# Patient Record
Sex: Female | Born: 1975 | Race: Asian | Hispanic: No | Marital: Married | Smoking: Never smoker
Health system: Southern US, Community
[De-identification: ages and names within clinical notes are randomized; demographics above are authoritative.]

## PROBLEM LIST (undated history)

## (undated) DIAGNOSIS — J45909 Unspecified asthma, uncomplicated: Secondary | ICD-10-CM

## (undated) DIAGNOSIS — D509 Iron deficiency anemia, unspecified: Secondary | ICD-10-CM

## (undated) DIAGNOSIS — E042 Nontoxic multinodular goiter: Secondary | ICD-10-CM

## (undated) DIAGNOSIS — E785 Hyperlipidemia, unspecified: Secondary | ICD-10-CM

## (undated) HISTORY — DX: Unspecified asthma, uncomplicated: J45.909

## (undated) HISTORY — PX: WISDOM TOOTH EXTRACTION: SHX21

## (undated) HISTORY — DX: Hyperlipidemia, unspecified: E78.5

## (undated) HISTORY — PX: TUBAL LIGATION: SHX77

## (undated) HISTORY — DX: Nontoxic multinodular goiter: E04.2

## (undated) HISTORY — DX: Iron deficiency anemia, unspecified: D50.9

---

## 1997-12-09 ENCOUNTER — Ambulatory Visit (HOSPITAL_COMMUNITY): Admission: RE | Admit: 1997-12-09 | Discharge: 1997-12-09 | Payer: Self-pay | Admitting: Obstetrics and Gynecology

## 1998-01-13 ENCOUNTER — Encounter: Payer: Self-pay | Admitting: Obstetrics and Gynecology

## 1998-01-13 ENCOUNTER — Inpatient Hospital Stay (HOSPITAL_COMMUNITY): Admission: RE | Admit: 1998-01-13 | Discharge: 1998-01-13 | Payer: Self-pay | Admitting: Obstetrics and Gynecology

## 1998-01-16 ENCOUNTER — Encounter: Admission: RE | Admit: 1998-01-16 | Discharge: 1998-02-18 | Payer: Self-pay | Admitting: Obstetrics and Gynecology

## 1998-01-21 ENCOUNTER — Inpatient Hospital Stay (HOSPITAL_COMMUNITY): Admission: AD | Admit: 1998-01-21 | Discharge: 1998-01-21 | Payer: Self-pay | Admitting: Obstetrics and Gynecology

## 1998-02-12 ENCOUNTER — Inpatient Hospital Stay (HOSPITAL_COMMUNITY): Admission: AD | Admit: 1998-02-12 | Discharge: 1998-02-12 | Payer: Self-pay

## 1998-02-17 ENCOUNTER — Encounter: Payer: Self-pay | Admitting: Obstetrics and Gynecology

## 1998-02-17 ENCOUNTER — Inpatient Hospital Stay (HOSPITAL_COMMUNITY): Admission: AD | Admit: 1998-02-17 | Discharge: 1998-02-20 | Payer: Self-pay | Admitting: Obstetrics and Gynecology

## 1998-07-31 ENCOUNTER — Ambulatory Visit: Admission: RE | Admit: 1998-07-31 | Discharge: 1998-07-31 | Payer: Self-pay | Admitting: Obstetrics and Gynecology

## 1998-07-31 ENCOUNTER — Encounter: Payer: Self-pay | Admitting: Obstetrics and Gynecology

## 1999-10-08 ENCOUNTER — Other Ambulatory Visit: Admission: RE | Admit: 1999-10-08 | Discharge: 1999-10-08 | Payer: Self-pay | Admitting: Obstetrics and Gynecology

## 1999-12-14 ENCOUNTER — Ambulatory Visit (HOSPITAL_COMMUNITY): Admission: RE | Admit: 1999-12-14 | Discharge: 1999-12-14 | Payer: Self-pay | Admitting: Obstetrics and Gynecology

## 1999-12-14 ENCOUNTER — Encounter: Payer: Self-pay | Admitting: Obstetrics and Gynecology

## 2000-01-21 ENCOUNTER — Encounter: Payer: Self-pay | Admitting: Obstetrics and Gynecology

## 2000-01-21 ENCOUNTER — Ambulatory Visit (HOSPITAL_COMMUNITY): Admission: RE | Admit: 2000-01-21 | Discharge: 2000-01-21 | Payer: Self-pay | Admitting: Obstetrics and Gynecology

## 2000-02-16 ENCOUNTER — Encounter (HOSPITAL_COMMUNITY): Admission: RE | Admit: 2000-02-16 | Discharge: 2000-04-19 | Payer: Self-pay | Admitting: Obstetrics and Gynecology

## 2000-02-18 ENCOUNTER — Encounter: Payer: Self-pay | Admitting: Obstetrics and Gynecology

## 2000-03-23 ENCOUNTER — Encounter: Payer: Self-pay | Admitting: Obstetrics and Gynecology

## 2000-03-30 ENCOUNTER — Encounter: Payer: Self-pay | Admitting: Obstetrics and Gynecology

## 2000-04-07 ENCOUNTER — Encounter: Payer: Self-pay | Admitting: Obstetrics and Gynecology

## 2000-04-07 ENCOUNTER — Ambulatory Visit (HOSPITAL_COMMUNITY): Admission: RE | Admit: 2000-04-07 | Discharge: 2000-04-07 | Payer: Self-pay | Admitting: Obstetrics and Gynecology

## 2000-04-14 ENCOUNTER — Encounter: Payer: Self-pay | Admitting: Obstetrics and Gynecology

## 2000-04-17 ENCOUNTER — Inpatient Hospital Stay (HOSPITAL_COMMUNITY): Admission: AD | Admit: 2000-04-17 | Discharge: 2000-04-17 | Payer: Self-pay | Admitting: Obstetrics and Gynecology

## 2000-04-18 ENCOUNTER — Inpatient Hospital Stay (HOSPITAL_COMMUNITY): Admission: AD | Admit: 2000-04-18 | Discharge: 2000-04-20 | Payer: Self-pay | Admitting: Obstetrics and Gynecology

## 2002-08-17 ENCOUNTER — Other Ambulatory Visit: Admission: RE | Admit: 2002-08-17 | Discharge: 2002-08-17 | Payer: Self-pay | Admitting: Obstetrics and Gynecology

## 2002-09-24 ENCOUNTER — Ambulatory Visit (HOSPITAL_COMMUNITY): Admission: RE | Admit: 2002-09-24 | Discharge: 2002-09-24 | Payer: Self-pay | Admitting: Obstetrics and Gynecology

## 2002-09-24 ENCOUNTER — Encounter: Payer: Self-pay | Admitting: Obstetrics and Gynecology

## 2003-01-02 ENCOUNTER — Inpatient Hospital Stay (HOSPITAL_COMMUNITY): Admission: AD | Admit: 2003-01-02 | Discharge: 2003-01-02 | Payer: Self-pay | Admitting: Obstetrics and Gynecology

## 2003-01-03 ENCOUNTER — Ambulatory Visit (HOSPITAL_COMMUNITY): Admission: RE | Admit: 2003-01-03 | Discharge: 2003-01-03 | Payer: Self-pay | Admitting: Internal Medicine

## 2003-01-04 ENCOUNTER — Encounter: Admission: RE | Admit: 2003-01-04 | Discharge: 2003-01-04 | Payer: Self-pay | Admitting: Obstetrics and Gynecology

## 2003-01-04 ENCOUNTER — Inpatient Hospital Stay (HOSPITAL_COMMUNITY): Admission: AD | Admit: 2003-01-04 | Discharge: 2003-01-04 | Payer: Self-pay | Admitting: Obstetrics and Gynecology

## 2003-01-08 ENCOUNTER — Encounter: Admission: RE | Admit: 2003-01-08 | Discharge: 2003-01-08 | Payer: Self-pay | Admitting: Obstetrics and Gynecology

## 2003-01-11 ENCOUNTER — Encounter: Admission: RE | Admit: 2003-01-11 | Discharge: 2003-01-11 | Payer: Self-pay | Admitting: Obstetrics and Gynecology

## 2003-01-15 ENCOUNTER — Encounter: Admission: RE | Admit: 2003-01-15 | Discharge: 2003-01-15 | Payer: Self-pay | Admitting: Orthopedic Surgery

## 2003-01-18 ENCOUNTER — Encounter: Admission: RE | Admit: 2003-01-18 | Discharge: 2003-01-18 | Payer: Self-pay | Admitting: Obstetrics and Gynecology

## 2003-01-22 ENCOUNTER — Encounter: Admission: RE | Admit: 2003-01-22 | Discharge: 2003-01-22 | Payer: Self-pay | Admitting: Obstetrics and Gynecology

## 2003-01-25 ENCOUNTER — Encounter: Admission: RE | Admit: 2003-01-25 | Discharge: 2003-01-25 | Payer: Self-pay | Admitting: Obstetrics and Gynecology

## 2003-01-28 ENCOUNTER — Encounter: Admission: RE | Admit: 2003-01-28 | Discharge: 2003-01-28 | Payer: Self-pay | Admitting: Obstetrics and Gynecology

## 2003-01-30 ENCOUNTER — Encounter: Admission: RE | Admit: 2003-01-30 | Discharge: 2003-01-30 | Payer: Self-pay | Admitting: Obstetrics and Gynecology

## 2003-02-04 ENCOUNTER — Encounter: Admission: RE | Admit: 2003-02-04 | Discharge: 2003-02-04 | Payer: Self-pay | Admitting: Obstetrics and Gynecology

## 2003-02-07 ENCOUNTER — Encounter: Admission: RE | Admit: 2003-02-07 | Discharge: 2003-02-07 | Payer: Self-pay | Admitting: Obstetrics and Gynecology

## 2003-02-14 ENCOUNTER — Encounter: Admission: RE | Admit: 2003-02-14 | Discharge: 2003-02-14 | Payer: Self-pay | Admitting: Obstetrics and Gynecology

## 2003-02-18 ENCOUNTER — Encounter: Admission: RE | Admit: 2003-02-18 | Discharge: 2003-02-18 | Payer: Self-pay | Admitting: Obstetrics and Gynecology

## 2003-02-19 ENCOUNTER — Inpatient Hospital Stay (HOSPITAL_COMMUNITY): Admission: AD | Admit: 2003-02-19 | Discharge: 2003-02-21 | Payer: Self-pay | Admitting: Obstetrics and Gynecology

## 2003-04-22 ENCOUNTER — Encounter (HOSPITAL_COMMUNITY): Admission: RE | Admit: 2003-04-22 | Discharge: 2003-07-21 | Payer: Self-pay | Admitting: Endocrinology

## 2003-09-06 ENCOUNTER — Other Ambulatory Visit: Admission: RE | Admit: 2003-09-06 | Discharge: 2003-09-06 | Payer: Self-pay | Admitting: Obstetrics and Gynecology

## 2004-01-06 ENCOUNTER — Ambulatory Visit: Payer: Self-pay | Admitting: Endocrinology

## 2004-02-06 ENCOUNTER — Ambulatory Visit: Payer: Self-pay | Admitting: Endocrinology

## 2004-02-25 ENCOUNTER — Inpatient Hospital Stay (HOSPITAL_COMMUNITY): Admission: AD | Admit: 2004-02-25 | Discharge: 2004-02-25 | Payer: Self-pay | Admitting: Obstetrics and Gynecology

## 2004-02-28 ENCOUNTER — Ambulatory Visit: Payer: Self-pay | Admitting: Endocrinology

## 2004-03-06 ENCOUNTER — Encounter (INDEPENDENT_AMBULATORY_CARE_PROVIDER_SITE_OTHER): Payer: Self-pay | Admitting: Specialist

## 2004-03-06 ENCOUNTER — Inpatient Hospital Stay (HOSPITAL_COMMUNITY): Admission: RE | Admit: 2004-03-06 | Discharge: 2004-03-09 | Payer: Self-pay | Admitting: Obstetrics and Gynecology

## 2004-03-31 ENCOUNTER — Ambulatory Visit: Payer: Self-pay | Admitting: Endocrinology

## 2004-06-01 ENCOUNTER — Ambulatory Visit: Payer: Self-pay | Admitting: Internal Medicine

## 2004-06-08 ENCOUNTER — Ambulatory Visit: Payer: Self-pay | Admitting: Endocrinology

## 2005-11-29 ENCOUNTER — Ambulatory Visit: Payer: Self-pay | Admitting: Endocrinology

## 2005-12-14 ENCOUNTER — Encounter (HOSPITAL_COMMUNITY): Admission: RE | Admit: 2005-12-14 | Discharge: 2005-12-15 | Payer: Self-pay | Admitting: Endocrinology

## 2005-12-23 ENCOUNTER — Encounter: Admission: RE | Admit: 2005-12-23 | Discharge: 2005-12-23 | Payer: Self-pay | Admitting: Endocrinology

## 2006-11-22 ENCOUNTER — Encounter: Payer: Self-pay | Admitting: Endocrinology

## 2006-11-22 DIAGNOSIS — E042 Nontoxic multinodular goiter: Secondary | ICD-10-CM | POA: Insufficient documentation

## 2009-10-31 ENCOUNTER — Ambulatory Visit: Payer: Self-pay | Admitting: Endocrinology

## 2009-10-31 LAB — CONVERTED CEMR LAB: TSH: 1.76 microintl units/mL (ref 0.35–5.50)

## 2009-11-03 ENCOUNTER — Ambulatory Visit: Payer: Self-pay | Admitting: Cardiology

## 2009-11-03 ENCOUNTER — Observation Stay (HOSPITAL_COMMUNITY): Admission: EM | Admit: 2009-11-03 | Discharge: 2009-11-07 | Payer: Self-pay | Admitting: Emergency Medicine

## 2009-11-05 ENCOUNTER — Encounter (INDEPENDENT_AMBULATORY_CARE_PROVIDER_SITE_OTHER): Payer: Self-pay | Admitting: Internal Medicine

## 2009-11-17 ENCOUNTER — Encounter: Admission: RE | Admit: 2009-11-17 | Discharge: 2009-12-01 | Payer: Self-pay | Admitting: Family Medicine

## 2010-02-04 ENCOUNTER — Encounter: Admission: RE | Admit: 2010-02-04 | Discharge: 2010-02-04 | Payer: Self-pay | Admitting: Family Medicine

## 2010-03-28 ENCOUNTER — Encounter: Payer: Self-pay | Admitting: Endocrinology

## 2010-03-30 ENCOUNTER — Encounter: Payer: Self-pay | Admitting: Obstetrics and Gynecology

## 2010-04-07 NOTE — Assessment & Plan Note (Signed)
Summary: OV ---THY   STC   Vital Signs:  Patient profile:   35 year old female Height:      63 inches (160.02 cm) Weight:      98.12 pounds (44.60 kg) BMI:     17.44 O2 Sat:      97 % on Room air Temp:     98.3 degrees F (36.83 degrees C) oral Pulse rate:   67 / minute BP sitting:   100 / 64  (left arm) Cuff size:   regular  Vitals Entered By: Brenton Grills MA (October 31, 2009 2:58 PM)  O2 Flow:  Room air CC: Thyroid checkup/pt is not taking any medications/aj   CC:  Thyroid checkup/pt is not taking any medications/aj.  History of Present Illness: 4 years ago, pt had i-131 rx for hyperthyroidism, due to multinodular goiter.  she did not require any medication after that.  she was last seen here approx 3 years ago.  pt states she feels well in general, except for weight loss.   she has had tubal ligation.  Current Medications (verified): 1)  Multivitamins   Tabs (Multiple Vitamin) .... Take 1 By Mouth Qd  Allergies (verified): No Known Drug Allergies  Past History:  Past Medical History: GOITER, MULTINODULAR (ICD-241.1)  Past Surgical History: Reviewed history from 11/22/2006 and no changes required. Tubal ligation (2005)  Family History: Reviewed history and no changes required. sister had resection of uncertain type of goiter  Social History: Reviewed history and no changes required. married originally from [place]  Review of Systems       she sometimes has menses 2/month.  Physical Exam  General:  normal appearance.   Neck:  i do not appreciate any thyroid enlargement or goiter Neurologic:  no tremor Skin:  not diaphoretic Additional Exam:   FastTSH                   1.76 uIU/mL     Impression & Recommendations:  Problem # 1:  GOITER, MULTINODULAR (ICD-241.1) Assessment Improved euthyroid  Other Orders: TLB-TSH (Thyroid Stimulating Hormone) (84443-TSH) Est. Patient Level III (04540)  Patient Instructions: 1)  blood tests are being ordered  for you today.  please call [phone] to hear your test results. 2)  Please schedule a follow-up appointment in 1 year. 3)  cc dr Ambrose Mantle

## 2010-05-21 LAB — PROTEIN ELECTROPH W RFLX QUANT IMMUNOGLOBULINS
Albumin ELP: 62.5 % (ref 55.8–66.1)
Alpha-1-Globulin: 3.4 % (ref 2.9–4.9)
Alpha-2-Globulin: 9.4 % (ref 7.1–11.8)
Beta 2: 3.9 % (ref 3.2–6.5)
Beta Globulin: 4.9 % (ref 4.7–7.2)
Gamma Globulin: 15.9 % (ref 11.1–18.8)
M-Spike, %: NOT DETECTED g/dL
Total Protein ELP: 7.4 g/dL (ref 6.0–8.3)

## 2010-05-21 LAB — ACTH STIMULATION, 3 TIME POINTS
Cortisol, 30 Min: 19.2 ug/dL (ref >20)
Cortisol, 60 Min: 27.5 ug/dL (ref >20)
Cortisol, Base: 3 ug/dL

## 2010-05-21 LAB — PROLACTIN: Prolactin: 9.2 ng/mL

## 2010-05-22 LAB — HEPATIC FUNCTION PANEL
ALT: 39 U/L — ABNORMAL HIGH (ref 0–35)
Indirect Bilirubin: 0.5 mg/dL (ref 0.3–0.9)
Total Protein: 5.1 g/dL — ABNORMAL LOW (ref 6.0–8.3)

## 2010-05-22 LAB — CBC
MCH: 29.4 pg (ref 26.0–34.0)
MCH: 30.7 pg (ref 26.0–34.0)
MCHC: 33.6 g/dL (ref 30.0–36.0)
Platelets: 325 10*3/uL (ref 150–400)
Platelets: 351 10*3/uL (ref 150–400)
RBC: 3.55 MIL/uL — ABNORMAL LOW (ref 3.87–5.11)
RDW: 12.5 % (ref 11.5–15.5)
RDW: 12.8 % (ref 11.5–15.5)

## 2010-05-22 LAB — VITAMIN B12: Vitamin B-12: 462 pg/mL (ref 211–911)

## 2010-05-22 LAB — CARDIAC PANEL(CRET KIN+CKTOT+MB+TROPI)
CK, MB: 0.5 ng/mL (ref 0.3–4.0)
CK, MB: 0.5 ng/mL (ref 0.3–4.0)
Total CK: 67 U/L (ref 7–177)
Troponin I: 0.01 ng/mL (ref 0.00–0.06)
Troponin I: 0.02 ng/mL (ref 0.00–0.06)

## 2010-05-22 LAB — D-DIMER, QUANTITATIVE: D-Dimer, Quant: 1.7 ug/mL-FEU — ABNORMAL HIGH (ref 0.00–0.48)

## 2010-05-22 LAB — MITOCHONDRIAL ANTIBODIES: Mitochondrial M2 Ab, IgG: NEGATIVE

## 2010-05-22 LAB — B. BURGDORFI ANTIBODIES: B burgdorferi Ab IgG+IgM: 0.45 {ISR}

## 2010-05-22 LAB — LIPID PANEL
Cholesterol: 174 mg/dL (ref 0–200)
HDL: 42 mg/dL (ref >39)
Total CHOL/HDL Ratio: 4.1 RATIO
Triglycerides: 75 mg/dL (ref <150)

## 2010-05-22 LAB — SEDIMENTATION RATE: Sed Rate: 23 mm/hr — ABNORMAL HIGH (ref 0–22)

## 2010-05-22 LAB — POCT I-STAT, CHEM 8
BUN: 6 mg/dL (ref 6–23)
HCT: 33 % — ABNORMAL LOW (ref 36.0–46.0)
Hemoglobin: 11.2 g/dL — ABNORMAL LOW (ref 12.0–15.0)
Sodium: 141 mEq/L (ref 135–145)
TCO2: 20 mmol/L (ref 0–100)

## 2010-05-22 LAB — COMPREHENSIVE METABOLIC PANEL
ALT: 42 U/L — ABNORMAL HIGH (ref 0–35)
AST: 40 U/L — ABNORMAL HIGH (ref 0–37)
Calcium: 8.3 mg/dL — ABNORMAL LOW (ref 8.4–10.5)
Creatinine, Ser: 0.54 mg/dL (ref 0.4–1.2)
GFR calc Af Amer: >60 mL/min (ref >60)
Sodium: 138 mEq/L (ref 135–145)
Total Protein: 5.8 g/dL — ABNORMAL LOW (ref 6.0–8.3)

## 2010-05-22 LAB — CORTISOL-AM, BLOOD: Cortisol - AM: 4.8 ug/dL (ref 4.3–22.4)

## 2010-05-22 LAB — TSH: TSH: 3.215 u[IU]/mL (ref 0.350–4.500)

## 2010-05-22 LAB — IRON AND TIBC
Saturation Ratios: 14 % — ABNORMAL LOW (ref 20–55)
TIBC: 266 ug/dL (ref 250–470)
UIBC: 230 ug/dL

## 2010-05-22 LAB — URINALYSIS, ROUTINE W REFLEX MICROSCOPIC
Glucose, UA: NEGATIVE mg/dL
Specific Gravity, Urine: 1.005 (ref 1.005–1.030)
Urobilinogen, UA: 0.2 mg/dL (ref 0.0–1.0)

## 2010-05-22 LAB — HEPATITIS PANEL, ACUTE
Hep A IgM: NEGATIVE
Hep B C IgM: NEGATIVE

## 2010-05-22 LAB — ANA
Anti Nuclear Antibody(ANA): NEGATIVE
Anti Nuclear Antibody(ANA): NEGATIVE

## 2010-05-22 LAB — POCT PREGNANCY, URINE: Preg Test, Ur: NEGATIVE

## 2010-05-22 LAB — BASIC METABOLIC PANEL
Chloride: 105 mEq/L (ref 96–112)
GFR calc Af Amer: >60 mL/min (ref >60)
GFR calc non Af Amer: >60 mL/min (ref >60)
Potassium: 3.8 mEq/L (ref 3.5–5.1)
Sodium: 138 mEq/L (ref 135–145)

## 2010-05-22 LAB — URINE MICROSCOPIC-ADD ON

## 2010-05-22 LAB — C-REACTIVE PROTEIN: CRP: 0 mg/dL — ABNORMAL LOW (ref <0.6)

## 2010-05-22 LAB — LIPASE, BLOOD: Lipase: 22 U/L (ref 11–59)

## 2010-05-22 LAB — T4, FREE: Free T4: 1.21 ng/dL (ref 0.80–1.80)

## 2010-05-22 LAB — FOLATE: Folate: 8.5 ng/mL

## 2010-05-22 LAB — RETICULOCYTES: Retic Ct Pct: 1 % (ref 0.4–3.1)

## 2010-07-24 NOTE — Discharge Summary (Signed)
[name], [name]                          ACCOUNT NO.:  [id number]   MEDICAL RECORD NO.:  [id number]                   PATIENT TYPE:  INP   LOCATION:  9107                                 FACILITY:  [facility]   PHYSICIAN:  Huel Cote, M.D.              DATE OF BIRTH:  05-12-1975   DATE OF ADMISSION:  02/19/2003  DATE OF DISCHARGE:  02/21/2003                                 DISCHARGE SUMMARY   DISCHARGE DIAGNOSES:  1. Status post vaginal birth after cesarean section at 39+ weeks gestation.  2. Previous cesarean section for breech presentation.  3. Prenatal care complicated by persistent left fetal pyelectasis followed     by serial ultrasounds and mild oligohydramnios.   DISCHARGE MEDICATIONS:  1. Motrin 600 mg p.o. q.6h.  2. Percocet one to two tablets p.o. q.4h. p.r.n.  3. Colace.  The patient is to buy and use over-the-counter b.i.d.   DISCHARGE FOLLOWUP:  The patient is to follow up in six weeks for her  routine postpartum examination.   HOSPITAL COURSE:  The patient is a 35 year old G3, P2-0-0-2 who was admitted  at 39+ weeks in active labor with contractions every five minutes and her  cervix was noted to be 6 cm dilated.  Prenatal care had been complicated by  a previous cesarean section with a successful VBAC following that.  Also,  she had persistent left fetal pyelectasis which was slowly increasing over  serial ultrasounds.  However, her NSTs remained reassuring and AFI on last  check was normal at 11.  The growth was thought to be small for gestational  age in approximately 10th percentile.   PRENATAL LABORATORIES:  A+.  Antibody negative.  Rubella immune.  Hepatitis  B surface antigen negative.  HIV negative.  RPR negative.  GC negative.  Chlamydia negative.  Group B Strep negative.  One hour Glucola 78.  Triple  screen declined.   PAST OBSTETRICAL HISTORY:  In 1999 she had a cesarean section for a footling  breech presentation of a 5 pound 15 ounce infant.   In 2002 she had a VBAC of  a 5 pound 5 ounce infant.  In 2004 this pregnancy which is pending.   PAST GYN HISTORY:  None.   PAST MEDICAL HISTORY:  History of mild asthma requiring no medications.   PAST SURGICAL HISTORY:  Cesarean section only.   ALLERGIES:  She has no known drug allergies.   PHYSICAL EXAMINATION:  VITAL SIGNS:  On admission she was afebrile with  stable vital signs.  Fetal heart rate was reactive.   HOSPITAL COURSE:  Shortly after admission the patient received IV pain  medication and was rechecked and found to be 9 cm with a bulging bag.  She  had assist of rupture of membranes performed and quickly thereafter reached  complete dilation and pushed well with a normal spontaneous vaginal delivery  of a vigorous female infant over intact  perineum.  Nuchal cord x1 was  reduced over head.  Apgars were 9 and 9.  Weight was 6 pounds 1 ounce.  Placenta was slightly slow to deliver, but did separate spontaneously.  Trailing membranes were teased out of the cervix and the uterus was gently  explored without obvious fragments noted.  Estimated blood loss was 300 mL.  Cervix, rectum, and vagina were all intact.  On postpartum day #2 she was  doing very well, had no complaints, and her lochia was minimal.  Therefore,  she was felt stable for discharge home and was discharged with Motrin and  Percocet as previously stated.                                               Huel Cote, M.D.    KR/MEDQ  D:  02/21/2003  T:  02/21/2003  Job:  [id number]

## 2010-07-24 NOTE — Discharge Summary (Signed)
[name], [name]              ACCOUNT NO.:  [id number]   MEDICAL RECORD NO.:  [id number]          PATIENT TYPE:  INP   LOCATION:  9124                          [facility]ACILITY:  [facility]   PHYSICIAN:  Huel Cote, M.D. DATE OF BIRTH:  13-Dec-1975   DATE OF ADMISSION:  03/06/2004  DATE OF DISCHARGE:  03/09/2004                                 DISCHARGE SUMMARY   DISCHARGE DIAGNOSIS:  1.  Term pregnancy at 38 plus weeks delivered.  2.  Breech presentation with failed version.  3.  Post previous cesarean section and desires sterility.  4.  Repeat low transverse cesarean section and bilateral tubal ligation and      scar revision.   DISCHARGE MEDICATIONS:  1.  Motrin 600 milligrams p.o. every 6 hours p.r.n. pain.  2.  Percocet 1-2 tablets p.o. every 4 hours.   DISCHARGE FOLLOWUP:  The patient is to follow-up in the office in 2 weeks  for incision check.   HOSPITAL COURSE:  The patient is a 35 year old gravida 4, para 3, who was  admitted for a scheduled repeat cesarean section given a persistent breech  presentation. She underwent cesarean section and bilateral tubal ligation  without difficulty and was delivered of a vigorous female infant in frank  breech presentation. Apgars were 10 and 10, weight was 7 pounds even. She  was admitted for routine postoperative care. She did very well. On postop  day #1 her hemoglobin was from 9.9 to 8.7. She did require a PCA. However,  converted to p.o. pain medications without difficulty by postoperative day  #3. She was tired and sore but was tolerating regular diet, voiding without  difficulty and remained afebrile. Therefore she was felt stable for  discharge home. Her staples were removed and Steri-Strips placed and she was  to return for follow-up in 2 weeks as stated.      KR/MEDQ  D:  04/27/2004  T:  04/27/2004  Job:  [id number]

## 2010-07-24 NOTE — Op Note (Signed)
[name], [name]              ACCOUNT NO.:  [id number]   MEDICAL RECORD NO.:  [id number]          PATIENT TYPE:  INP   LOCATION:  9124                          [facility]ACILITY:  [facility]   PHYSICIAN:  Huel Cote, M.D. DATE OF BIRTH:  02/02/1976   DATE OF PROCEDURE:  03/06/2004  DATE OF DISCHARGE:                                 OPERATIVE REPORT   PREOPERATIVE DIAGNOSES:  1.  Term pregnancy at 38-plus weeks.  2.  Breech presentation, with failed version.  3.  Previous cesarean section, desires sterility.   POSTOPERATIVE DIAGNOSES:  1.  Term pregnancy at 38-plus weeks.  2.  Breech presentation, with failed version.  3.  Previous cesarean section, desires sterility.   PROCEDURE:  Scar revision, repeat low transverse cesarean section, and  bilateral tubal ligation.   SURGEON:  Huel Cote, M.D.   ASSISTANT:  Malachi Pro. Ambrose Mantle, M.D.   ANESTHESIA:  Spinal.   FLUIDS, ESTIMATED BLOOD LOSS:  600 cc.   IV FLUIDS:  1,500 cc LR.   URINE OUTPUT:  300 cc, clear.   FINDINGS:  There was a vigorous female infant in the frank breech  presentation.  Apgars were 9 and 10.  Weight was 7 pounds even.   PROCEDURE:  The patient was taken to the operating room, where spinal  anesthesia was obtained without difficulty.  She was then prepped and draped  in the normal sterile fashion in the dorsal supine position with a leftward  tilt.  Preexisting scar was excised, and then the remaining tissue carried  through the underlying layer of fascia by sharp dissection and Bovie  cautery.  The fascia was nicked in the midline, and the incision was  extended laterally with Mayo scissors.  The inferior aspect was grasped with  Kocher clamps, elevated, and dissected off the underlying rectus muscles.  In similar fashion, the superior aspect was dissected off the rectus  muscles.  The rectus muscles were then separated in the midline, and the  abdominal cavity entered bluntly.  Peritoneal incision was  then extended  both superiorly and inferiorly, with careful attention to avoid both bowel  and bladder.  Bladder blade was then inserted, the vesicouterine peritoneum  identified, and the bladder flap created with Metzenbaum scissors.  The  lower uterine segment was then incised in a transverse fashion and the  uterine cavity entered bluntly.  This incision was then extended digitally  and the infant's bottom and legs delivered atraumatically.  Following that,  the arms were carefully reduced across the infant's chest and the infant's  head delivered in a flexed position.  The infant was bulb suctioned, the  cord clamped and cut, and handed off to the awaiting pediatricians.  The  cord blood was obtained, and the placenta was then delivered manually.  The  uterus was cleared of all clots and debris with a moist lap sponge.  The  uterine incision was then closed with 0 chromic in a running-locked fashion.  Good hemostasis was noted.  Attention was then turned to the left fallopian  tube, which was grasped with a Babcock clamp and the  mesosalpinx opened with  Bovie cautery.  A 2-3 cm segment of tube was then tied off with two ties of  0 plain suture and that portion of the tube amputated and handed off to  pathology.  The free pedicle was also additionally cauterized with Bovie  cautery and the pedicle returned to the abdomen.  In a similar fashion, the  patient's right fallopian tube was grasped with a Babcock clamp, traced out  to its fimbriated end, and the mesosalpinx opened with Bovie cautery.  This  was then similarly tied off with two free ties of 0 plain, and the 2-3 cm  segment was then amputated and handed off to pathology.  The pedicle was  additionally cauterized with Bovie cautery and returned to the abdomen.  Attention was then returned to the patient's incision which was hemostatic.  There was no active bleeding noted at the abdomen or pelvis, which was  irrigated, and the  rectus muscles were then reapproximated with several  interrupted sutures of 0 Vicryl.  The fascia was then closed with 0 Vicryl  in a running fashion.  The subcutaneous tissue was reapproximated with 2-0  plain, given the patient's very thin status and the need to pull the revised  scar closer together for stapling the skin.  The skin was then closed with  staples.  Sponge, lap, and needle counts were correct x 2, and the patient  was taken to the recovery room in stable condition.     Kath   KR/MEDQ  D:  03/06/2004  T:  03/06/2004  Job:  [id number]

## 2010-07-24 NOTE — Discharge Summary (Signed)
[facility]  PatientLYNDSIE, [name]                       MRN: [id number] Adm. Date:  [id number] Disc. Date: [id number] Attending:  Michaele Offer                           Discharge Summary  DISCHARGE DIAGNOSES:          1. Term pregnancy at 39 weeks.                               2. Previous cesarean section for breech                                  presentation.                               3. Small for gestational age fetus with this                                  pregnancy.                               4. Status post vaginal birth after cesarean                                  section.  DISCHARGE MEDICATIONS:        1. Motrin 600 mg p.o. q.6h. p.r.n.                               2. Percocet one to two tablets p.o. q.4h. p.r.n.  DISCHARGE FOLLOW-UP:          Patient is to follow-up in our office in six weeks for her routine postpartum examination.  HOSPITAL COURSE:              Patient is a 35 year old G2, P1-0-0-1 who was admitted at 39+ weeks with contractions every five minutes.  She had no leakage of fluid or vaginal bleeding on admission.  Cervical examination on admission was 90% effaced, 4 cm dilated, and -2 station.  Patient was admitted and given Stadol for pain relief and two hours later was reexamined and found to be completely effaced, 9 cm, and a 0 station.  She had assist of ruptured membranes performed with clear fluid at that point.  Her pregnancy had been complicated by a small for gestational age fetus which had been followed by weekly NSTs and AFI checks for several weeks.  Otherwise, pregnancy was uncomplicated except for a previous cesarean section and a desire for a VBAC.  PRENATAL LABORATORIES:        A+.  Antibody negative.  RPR nonreactive. Rubella immune.  Hepatitis B surface antigen negative.  HIV negative.  GC negative.  Chlamydia negative.  GBS negative.  PAST OBSTETRICAL HISTORY:     In 1999  she underwent a low transverse cesarean section of a 5 pound 15 ounce infant for breech presentation.  PAST  GYNECOLOGICAL HISTORY:   None.  PAST SURGICAL HISTORY:        Cesarean section only.  PAST MEDICAL HISTORY:         Patient has had a history of asthma, however, requires no medications.  ALLERGIES:                    None.  MEDICATIONS:                  Prenatal vitamins.  PHYSICAL EXAMINATION:  VITAL SIGNS:                  Patient on admission had a low grade temperature of 99.4, however, with hydration this responded and came down to 98.7.  Fetal heart rate was reactive.  Contractions were every three to four minutes. After rupture of membranes patient quickly reached dilation and pushed well with a normal spontaneous vaginal delivery of a vigorous female infant over a first degree perineal laceration.  She had local anesthesia with perineal lidocaine.  Infants weight was 5 pounds 5 ounces.  Apgars were 9 and 9. Placenta delivered spontaneously.  First degree laceration was repaired with 2-0 Vicryl and the estimated blood loss was less than 500 cc.  Cervix and rectum were intact.  At that point the patient and husband expressed some confusion over how to proceed with a bilateral tubal ligation.  They had not been aware that this was a surgical procedure and thought it could be performed vaginally at the time of delivery.  After the procedure was explained in detail the patient and husband declined to proceed at this time. Patient was then admitted for routine postpartum care and did very well. Postpartum day #2 she was afebrile with stable vital signs, had normal lochia and no complaints.  Her pain was well controlled with Motrin.  Therefore, she was discharged to home with followup as previously stated in six weeks. DD:  04/20/00 TD:  04/20/00 Job: [id number]

## 2010-07-24 NOTE — H&P (Signed)
NAMECOBIE, LEIDNER              ACCOUNT NO.:  [id number]   MEDICAL RECORD NO.:  [id number]          PATIENT TYPE:  INP   LOCATION:  NA                            [facility]  [facility]   PHYSICIAN:  Huel Cote, M.D. DATE OF BIRTH:  Mar 29, 1975   DATE OF ADMISSION:  03/06/2004  DATE OF DISCHARGE:                                HISTORY & PHYSICAL   The patient is a 35 year old, G4, P3-0-0-3, who is admitted for a repeat  cesarean section at [redacted] weeks gestation for a breech presentation.  The  patient's prenatal care had been complicated by a history of hyperthyroidism  and goiter, for which she sees Dr. Romero Belling.  She has been on PTU for  this condition and has been somewhat compliant with her PTU; however, her  last free T4 was 0.08, and her TSH 0.33; these were on February 06, 2004.  Her other prenatal course has been uneventful except for a history of  cesarean section with her first child for a breech presentation.  She then  had two successful vaginal deliveries and now again has a breech  presentation which failed external version that was attempted on February 24, 2002.   Prenatal labs are as follows:  A positive, antibody negative; RPR  nonreactive; Rubella immune; hepatitis C surface antigen negative; HIV  declined; GC negative; Chlamydia negative; triple screen was declined, and  group B strep was negative.   Her past gyn history is insignificant.  Past obstetrical history as stated  with her first pregnancy in 1999.  She had a low transverse cesarean section  for a footling breech presentation, delivered of a 5 pound 15 ounce female.  In 2002, she had a vaginal birth after cesarean section of a 5 pound 5 ounce  infant.  In 2004, she had a vaginal birth after cesarean section of a 6  pound 1 ounce infant, and her past surgical history significant only for her  cesarean section.   PAST MEDICAL HISTORY:  The hyperthyroidism and goiter, as stated.   PHYSICAL EXAMINATION:   VITAL SIGNS:  On admission, the patient is 113  pounds, blood pressure 98/50.  CARDIAC:  Regular rate and rhythm.  LUNGS:  Clear.  ABDOMEN:  Soft, gravid, and nontender.  Estimated fetal weight is 5.5-6  pounds.  She is confirmed to be breech by vaginal exam and ultrasound.  Cervix is 50% effaced, 2 cm, and a frank breech presentation.   The patient was counseled to the risks and benefits of proceeding with  surgery, including bleeding; infection; possible damage to bowel and  bladder.  The patient understands these risks and desires to proceed with  the surgery as stated given her failed version attempt.  The patient also is  desirous of proceeding with a tubal ligation for permanent sterility at the  time of her cesarean section.  We discussed the risk of failure of  approximately 1:100 and also the risk of ectopic pregnancy should pregnancy  occur, and she understands and desires to proceed with this as stated.     Kath   KR/MEDQ  D:  03/04/2004  T:  03/04/2004  Job:  [id number]

## 2010-09-11 ENCOUNTER — Other Ambulatory Visit: Payer: Self-pay | Admitting: Family Medicine

## 2010-09-11 DIAGNOSIS — N289 Disorder of kidney and ureter, unspecified: Secondary | ICD-10-CM

## 2010-09-18 ENCOUNTER — Ambulatory Visit
Admission: RE | Admit: 2010-09-18 | Discharge: 2010-09-18 | Disposition: A | Discharge status: Home / Self Care | Payer: BC Managed Care – PPO | Source: Ambulatory Visit | Attending: Family Medicine | Admitting: Family Medicine

## 2010-09-18 DIAGNOSIS — N289 Disorder of kidney and ureter, unspecified: Secondary | ICD-10-CM

## 2011-10-11 ENCOUNTER — Ambulatory Visit
Admission: RE | Admit: 2011-10-11 | Discharge: 2011-10-11 | Disposition: A | Discharge status: Home / Self Care | Payer: BC Managed Care – PPO | Source: Ambulatory Visit | Attending: Family Medicine | Admitting: Family Medicine

## 2011-10-11 ENCOUNTER — Other Ambulatory Visit: Payer: Self-pay | Admitting: Family Medicine

## 2014-06-30 IMAGING — CT CT HEAD W/O CM
2 series · 16 of 30 positions shown, 20 images · non-contrast
Comparison: 11/03/2009.

CLINICAL DATA: Headaches.

CT HEAD WITHOUT CONTRAST
TECHNIQUE: Contiguous axial images were obtained from the base of
the skull through the vertex without contrast.

[Series 2: head w/o · axial · non-contrast · 0.49mm/px · z∈[+57,+185]mm · 13 of 28 slices shown, 17 images]
[im 2/28  brain]
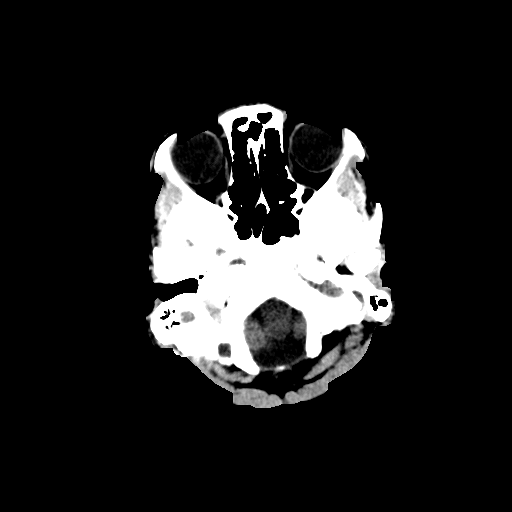
[im 2/28  bone]
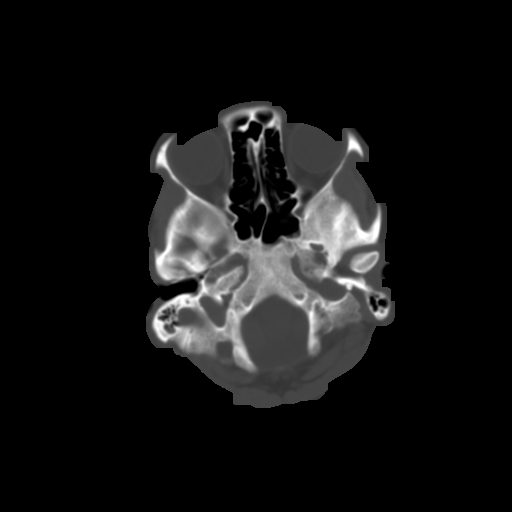
[im 4/28  brain]
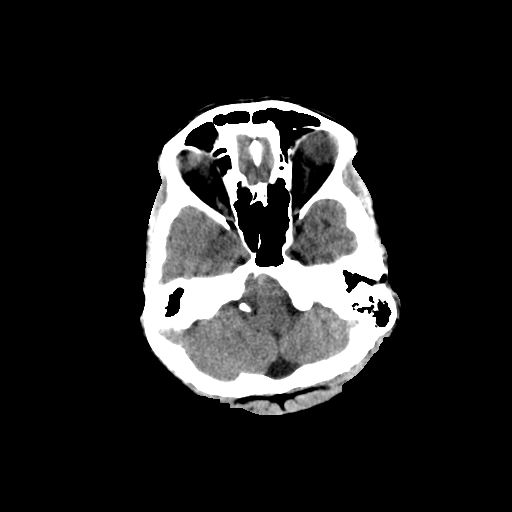
[im 6/28  brain]
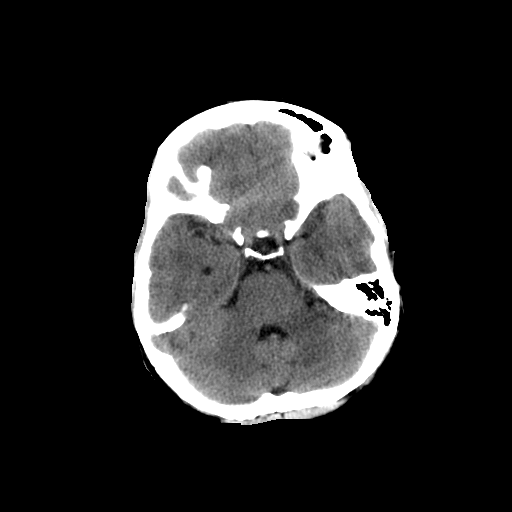
[im 8/28  brain]
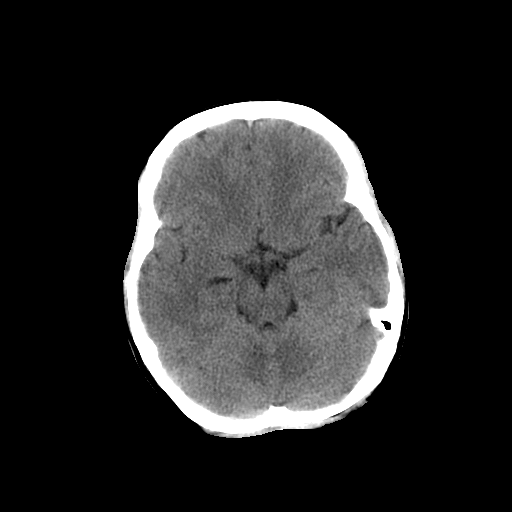
[im 10/28  brain]
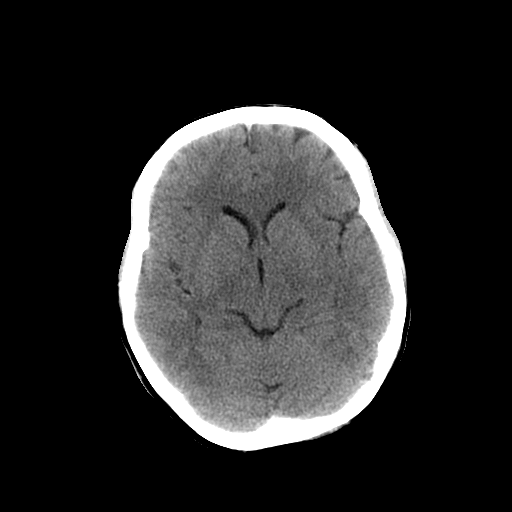
[im 10/28  bone]
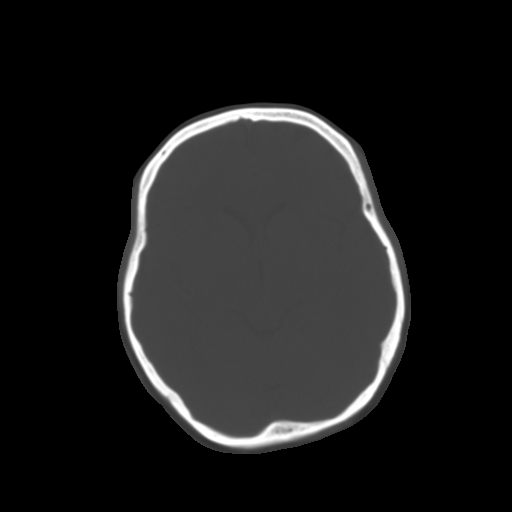
[im 12/28  brain]
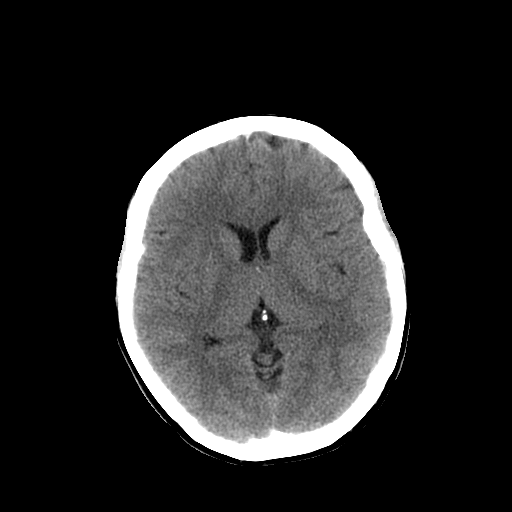
[im 14/28  brain]
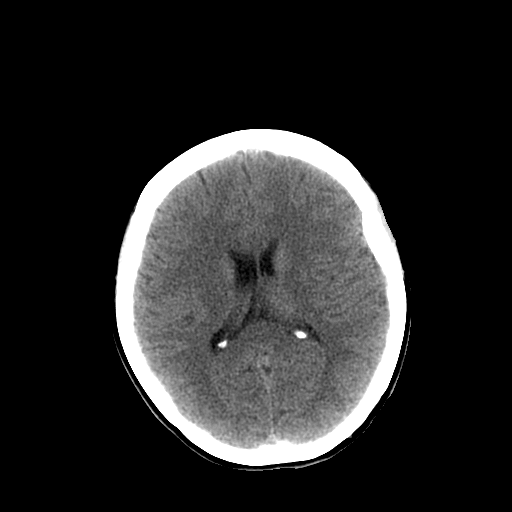
[im 16/28  brain]
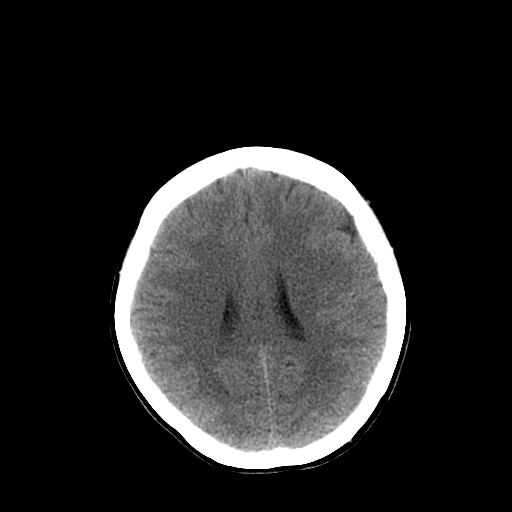
[im 18/28  brain]
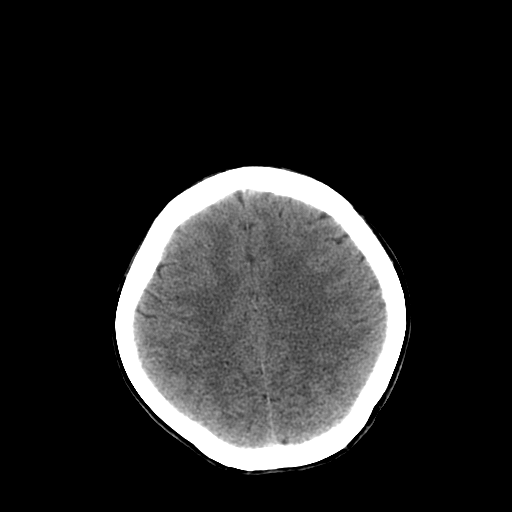
[im 18/28  bone]
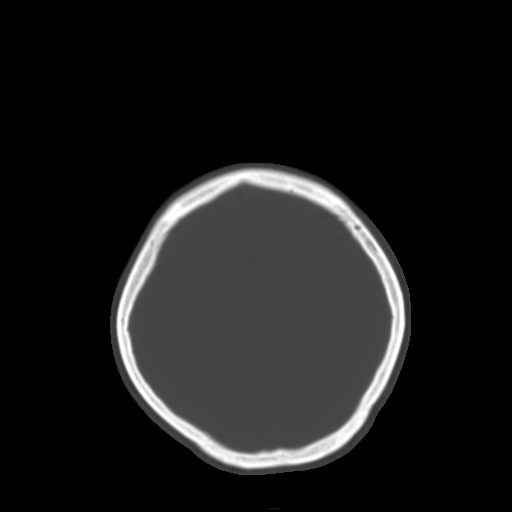
[im 20/28  brain]
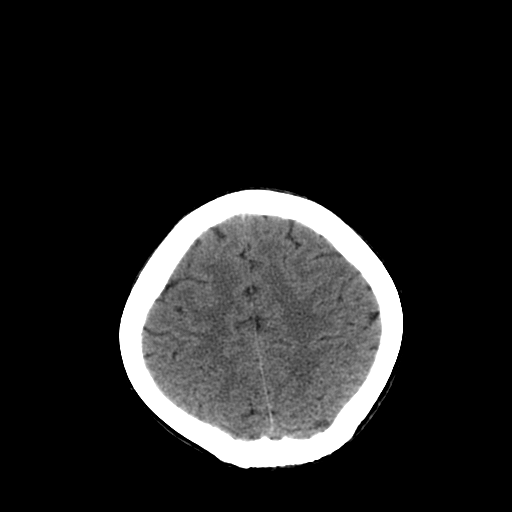
[im 22/28  brain]
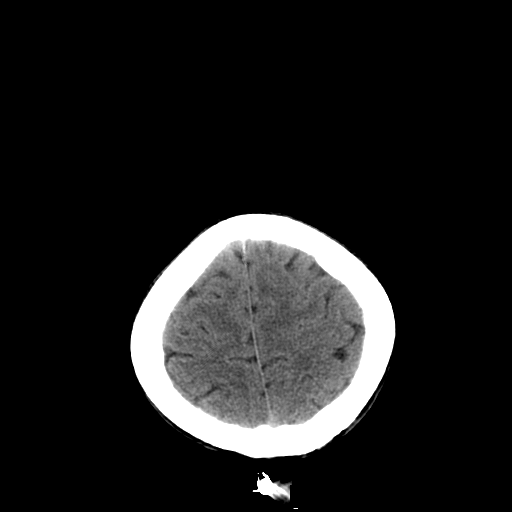
[im 24/28  brain]
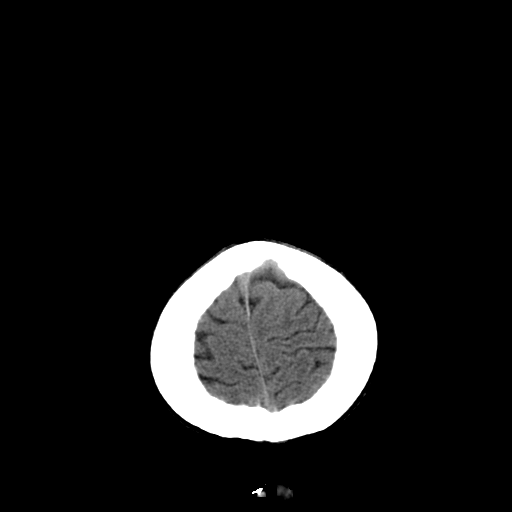
[im 26/28  brain]
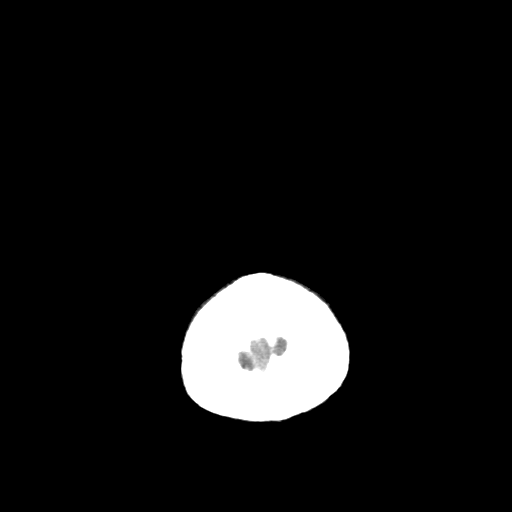
[im 26/28  bone]
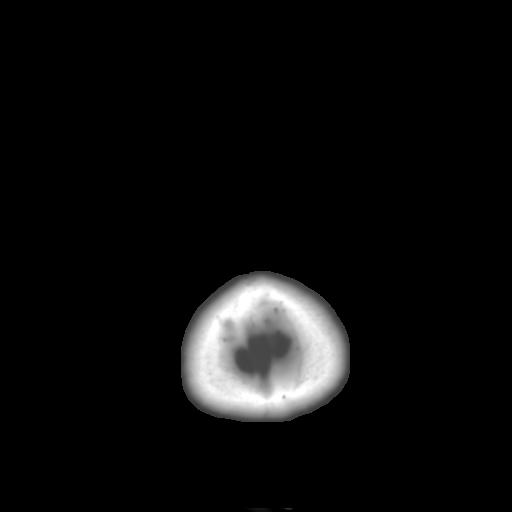

[Series 3: head bone · axial · 0.49mm/px · z∈[+57,+100]mm · 3 of 28 slices shown]
[im 2/28  bone]
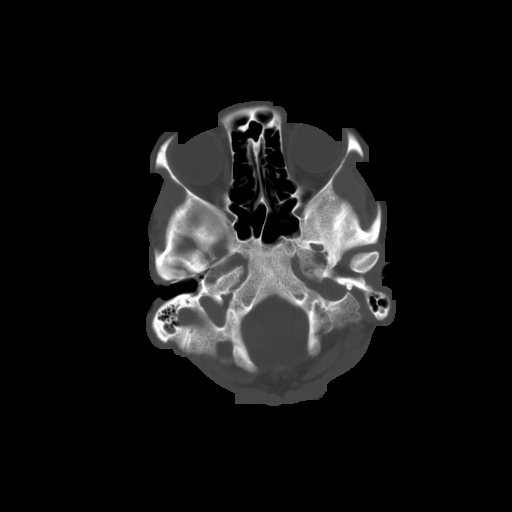
[im 6/28  bone]
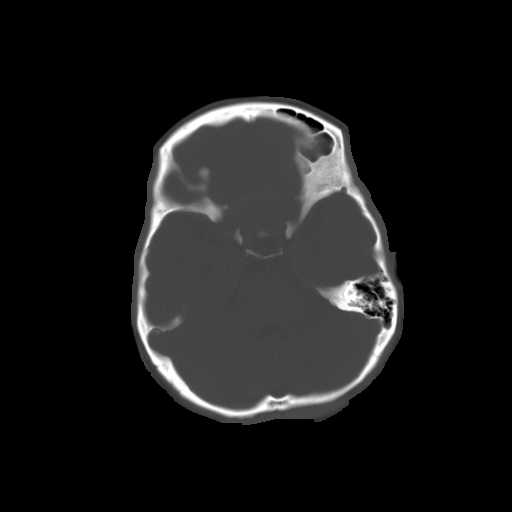
[im 10/28  bone]
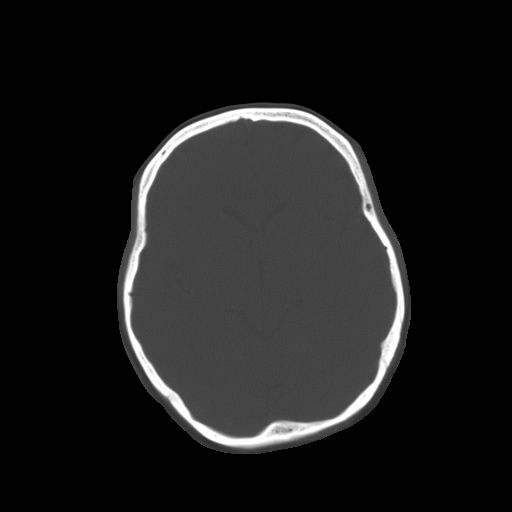

[16 of 30 positions shown; findings below may reference images not displayed]

FINDINGS: The ventricles are normal.  No extra-axial fluid
collections are seen.  The brainstem and cerebellum are
unremarkable.  No acute intracranial findings such as infarction or
hemorrhage.  No mass lesions.

The bony calvarium is intact.  The visualized paranasal sinuses and
mastoid air cells are clear.
IMPRESSION: No acute intracranial findings or mass lesion.  No change since
prior examination.

## 2015-04-04 ENCOUNTER — Ambulatory Visit (INDEPENDENT_AMBULATORY_CARE_PROVIDER_SITE_OTHER): Payer: BLUE CROSS/BLUE SHIELD | Admitting: Endocrinology

## 2015-04-04 ENCOUNTER — Encounter: Payer: Self-pay | Admitting: Endocrinology

## 2015-04-04 VITALS — BP 122/64 | HR 78 | Temp 98.0°F | Ht 61.75 in | Wt 112.0 lb

## 2015-04-04 DIAGNOSIS — E042 Nontoxic multinodular goiter: Secondary | ICD-10-CM

## 2015-04-04 LAB — TSH: TSH: 4.33 u[IU]/mL (ref 0.35–4.50)

## 2015-04-04 NOTE — Patient Instructions (Signed)
blood tests are requested for you today.  We'll let you know about the results. You should have the blood test each year. Also you should have your next examined each year.  i would be happy to do these, or you could have done done elsewhere.

## 2015-04-04 NOTE — Progress Notes (Signed)
   Subjective:    Patient ID: [name], female    DOB: 1975-12-27, 40 y.o.   MRN: [id number]  HPI Pt had I-131 rx in 2011   multinodular goiter. she did not require synthroid.  pt states slight intermittent cramps in the legs, and assoc numbness.  She is not at risk for pregnancy No past medical history on file.  No past surgical history on file.  Social History   Social History  . Marital Status: Married    Spouse Name: N/A  . Number of Children: N/A  . Years of Education: N/A   Occupational History  . Not on file.   Social History Main Topics  . Smoking status: Never Smoker   . Smokeless tobacco: Not on file  . Alcohol Use: 0.0 oz/week    0 Standard drinks or equivalent per week  . Drug Use: Not on file  . Sexual Activity: Not on file   Other Topics Concern  . Not on file   Social History Narrative  . No narrative on file    No current outpatient prescriptions on file prior to visit.   No current facility-administered medications on file prior to visit.    No Known Allergies  No family history on file.  BP 122/64 mmHg  Pulse 78  Temp(Src) 98 F (36.7 C) (Oral)  Ht 5' 1.75" (1.568 m)  Wt 112 lb (50.803 kg)  BMI 20.66 kg/m2  SpO2 97%  LMP 03/24/2015    Review of Systems denies depression, hair loss, sob, weight gain, constipation, blurry vision, cold intolerance, myalgias, dry skin, rhinorrhea, easy bruising, and syncope.      Objective:   Physical Exam VS: see vs page GEN: no distress HEAD: head: no deformity eyes: no periorbital swelling, no proptosis external nose and ears are normal mouth: no lesion seen NECK: thyroid is not enlarged, but has a slightly irreg surface.  No palpable nodule. CHEST WALL: no deformity LUNGS:  Clear to auscultation CV: reg rate and rhythm, no murmur.  MUSCULOSKELETAL: muscle bulk and strength are grossly normal.  no obvious joint swelling.  gait is normal and steady EXTEMITIES: no deformity.  no ulcer on  the feet.  feet are of normal color and temp.  no edema PULSES: dorsalis pedis intact bilat.  no carotid bruit NEURO:  cn 2-12 grossly intact.   readily moves all 4's.  sensation is intact to touch on the feet SKIN:  Normal texture and temperature.  No rash or suspicious lesion is visible.   NODES:  None palpable at the neck PSYCH: alert, well-oriented.  Does not appear anxious nor depressed.   Lab Results  Component Value Date   TSH 4.33 04/04/2015       Assessment & Plan:  Hyperthyroidism, resolved with I-131 rx Multinodular goiter, not clinically palpable.  she declines f/u US Cramps, not thyroid-related  Patient is advised the following: Patient Instructions  blood tests are requested for you today.  We'll let you know about the results. You should have the blood test each year. Also you should have your next examined each year.  i would be happy to do these, or you could have done done elsewhere.

## 2016-06-24 ENCOUNTER — Ambulatory Visit: Payer: BLUE CROSS/BLUE SHIELD | Admitting: Endocrinology

## 2016-06-24 ENCOUNTER — Ambulatory Visit (INDEPENDENT_AMBULATORY_CARE_PROVIDER_SITE_OTHER): Payer: BLUE CROSS/BLUE SHIELD | Admitting: Endocrinology

## 2016-06-24 ENCOUNTER — Encounter: Payer: Self-pay | Admitting: Endocrinology

## 2016-06-24 DIAGNOSIS — J45909 Unspecified asthma, uncomplicated: Secondary | ICD-10-CM | POA: Diagnosis not present

## 2016-06-24 DIAGNOSIS — E042 Nontoxic multinodular goiter: Secondary | ICD-10-CM | POA: Diagnosis not present

## 2016-06-24 DIAGNOSIS — E890 Postprocedural hypothyroidism: Secondary | ICD-10-CM | POA: Diagnosis not present

## 2016-06-24 DIAGNOSIS — E039 Hypothyroidism, unspecified: Secondary | ICD-10-CM | POA: Insufficient documentation

## 2016-06-24 DIAGNOSIS — D509 Iron deficiency anemia, unspecified: Secondary | ICD-10-CM | POA: Diagnosis not present

## 2016-06-24 MED ORDER — LEVOTHYROXINE SODIUM 25 MCG PO TABS: 25.0000 ug | ORAL_TABLET | Freq: Every day | ORAL | 3 refills | Status: DC

## 2016-06-24 NOTE — Progress Notes (Signed)
   Subjective:    Patient ID: [name], female    DOB: [birth year], 41 y.o.   MRN: [id number]  HPI Pt returns for f/u of multinodular goiter (she had RAI in 2011; she became euthyroid, and has stayed euthyroid off any rx; she has not had f/u US after RAI, due to cost; she is not at risk for pregnancy).  pt states she feels well in general, except for fatigue.   Past Medical History:  Diagnosis Date  . Asthma   . Iron (Fe) deficiency anemia   . Multinodular goiter     No past surgical history on file.  Social History   Social History  . Marital status: Married    Spouse name: N/A  . Number of children: N/A  . Years of education: N/A   Occupational History  . Not on file.   Social History Main Topics  . Smoking status: Never Smoker  . Smokeless tobacco: Never Used  . Alcohol use 0.0 oz/week  . Drug use: Unknown  . Sexual activity: Not on file   Other Topics Concern  . Not on file   Social History Narrative  . No narrative on file    No current outpatient prescriptions on file prior to visit.   No current facility-administered medications on file prior to visit.     No Known Allergies  No family history on file.  BP 118/72   Pulse 83   Ht 5' 1.5" (1.562 m)   Wt 116 lb (52.6 kg)   SpO2 99%   BMI 21.56 kg/m    Review of Systems Little if any neck swelling.      Objective:   Physical Exam Physical Exam VS: see vs page NECK: thyroid is not enlarged, but has a slightly irreg surface.  No palpable nodule.    outside test results are reviewed:  TSH=4.99    Assessment & Plan:  Post-RAI hypothyroidism, new multinodular goiter: she again declines f/u US, due to cost.   Patient Instructions  I have sent a prescription to your pharmacy, to start a thyroid hormone pill.  You will likely need this indefinitely.   Please repeat the blood test here, in 1 month. You do not need to be fasting.   Please return here in 1 year.

## 2016-06-24 NOTE — Patient Instructions (Addendum)
I have sent a prescription to your pharmacy, to start a thyroid hormone pill.  You will likely need this indefinitely.   Please repeat the blood test here, in 1 month. You do not need to be fasting.   Please return here in 1 year.

## 2016-07-28 ENCOUNTER — Other Ambulatory Visit (INDEPENDENT_AMBULATORY_CARE_PROVIDER_SITE_OTHER): Payer: BLUE CROSS/BLUE SHIELD

## 2016-07-28 DIAGNOSIS — E890 Postprocedural hypothyroidism: Secondary | ICD-10-CM

## 2016-07-28 LAB — TSH: TSH: 1.63 u[IU]/mL (ref 0.35–4.50)

## 2016-11-10 ENCOUNTER — Ambulatory Visit
Admission: RE | Admit: 2016-11-10 | Discharge: 2016-11-10 | Disposition: A | Discharge status: Home / Self Care | Payer: BLUE CROSS/BLUE SHIELD | Source: Ambulatory Visit | Attending: Family Medicine | Admitting: Family Medicine

## 2016-11-10 ENCOUNTER — Other Ambulatory Visit: Payer: Self-pay | Admitting: Family Medicine

## 2016-11-10 DIAGNOSIS — J208 Acute bronchitis due to other specified organisms: Secondary | ICD-10-CM

## 2016-11-10 DIAGNOSIS — J45909 Unspecified asthma, uncomplicated: Secondary | ICD-10-CM

## 2017-06-24 ENCOUNTER — Ambulatory Visit: Payer: BLUE CROSS/BLUE SHIELD | Admitting: Endocrinology

## 2017-07-02 ENCOUNTER — Other Ambulatory Visit: Payer: Self-pay | Admitting: Endocrinology

## 2017-07-22 ENCOUNTER — Ambulatory Visit: Payer: BLUE CROSS/BLUE SHIELD | Admitting: Endocrinology

## 2017-07-22 DIAGNOSIS — Z0289 Encounter for other administrative examinations: Secondary | ICD-10-CM

## 2017-10-23 ENCOUNTER — Other Ambulatory Visit: Payer: Self-pay | Admitting: Endocrinology

## 2017-10-23 NOTE — Telephone Encounter (Signed)
Please refill x 1 Ov is due  

## 2018-01-22 ENCOUNTER — Other Ambulatory Visit: Payer: Self-pay | Admitting: Endocrinology

## 2018-01-22 NOTE — Telephone Encounter (Signed)
Please refill x 1 Ov is due  

## 2018-02-25 ENCOUNTER — Other Ambulatory Visit: Payer: Self-pay | Admitting: Endocrinology

## 2018-02-26 NOTE — Telephone Encounter (Signed)
Please refill x 1 Ov is due  

## 2018-04-12 ENCOUNTER — Other Ambulatory Visit: Payer: Self-pay | Admitting: Endocrinology

## 2018-05-05 ENCOUNTER — Encounter: Payer: Self-pay | Admitting: Endocrinology

## 2018-05-05 ENCOUNTER — Ambulatory Visit (INDEPENDENT_AMBULATORY_CARE_PROVIDER_SITE_OTHER): Payer: BLUE CROSS/BLUE SHIELD | Admitting: Endocrinology

## 2018-05-05 ENCOUNTER — Ambulatory Visit: Payer: BLUE CROSS/BLUE SHIELD | Admitting: Endocrinology

## 2018-05-05 VITALS — BP 102/64 | HR 72 | Ht 61.5 in | Wt 133.4 lb

## 2018-05-05 DIAGNOSIS — E890 Postprocedural hypothyroidism: Secondary | ICD-10-CM | POA: Diagnosis not present

## 2018-05-05 LAB — TSH: TSH: 2.79 u[IU]/mL (ref 0.35–4.50)

## 2018-05-05 MED ORDER — LEVOTHYROXINE SODIUM 25 MCG PO TABS
25.0000 ug | ORAL_TABLET | Freq: Every day | ORAL | 3 refills | Status: DC
Start: 2018-05-05 — End: 2018-05-05

## 2018-05-05 NOTE — Progress Notes (Signed)
Subjective:    Patient ID: [name], female    DOB: 1975/10/15, 43 y.o.   MRN: [id number]  HPI Pt returns for f/u of multinodular goiter (she had RAI in 2011; she became euthyroid, and stayed euthyroid off any rx, until she needed synthroid in 2018; she has not had f/u US after RAI, due to cost; she is not at risk for pregnancy).  pt states she feels well in general.  She ran out of synthroid a few weeks ago.  Past Medical History:  Diagnosis Date  . Asthma   . Iron (Fe) deficiency anemia   . Multinodular goiter      Social History   Socioeconomic History  . Marital status: Married    Spouse name: Not on file  . Number of children: Not on file  . Years of education: Not on file  . Highest education level: Not on file  Occupational History  . Not on file  Social Needs  . Financial resource strain: Not on file  . Food insecurity:    Worry: Not on file    Inability: Not on file  . Transportation needs:    Medical: Not on file    Non-medical: Not on file  Tobacco Use  . Smoking status: Never Smoker  . Smokeless tobacco: Never Used  Substance and Sexual Activity  . Alcohol use: Yes    Alcohol/week: 0.0 standard drinks  . Drug use: Not on file  . Sexual activity: Not on file  Lifestyle  . Physical activity:    Days per week: Not on file    Minutes per session: Not on file  . Stress: Not on file  Relationships  . Social connections:    Talks on phone: Not on file    Gets together: Not on file    Attends religious service: Not on file    Active member of club or organization: Not on file    Attends meetings of clubs or organizations: Not on file    Relationship status: Not on file  . Intimate partner violence:    Fear of current or ex partner: Not on file    Emotionally abused: Not on file    Physically abused: Not on file    Forced sexual activity: Not on file  Other Topics Concern  . Not on file  Social History Narrative  . Not on file    No current  outpatient medications on file prior to visit.   No current facility-administered medications on file prior to visit.     No Known Allergies  No family history on file.  BP 102/64 (BP Location: Left Arm, Patient Position: Sitting, Cuff Size: Normal)   Pulse 72   Ht 5' 1.5" (1.562 m)   Wt 133 lb 6.4 oz (60.5 kg)   SpO2 92%   BMI 24.80 kg/m    Review of Systems Denies neck swelling    Objective:   Physical Exam VITAL SIGNS:  See vs page GENERAL: no distress NECK: There is no palpable thyroid enlargement.  No thyroid nodule is palpable.  No palpable lymphadenopathy at the anterior neck.   Lab Results  Component Value Date   TSH 1.63 07/28/2016        Assessment & Plan:  Post-RAI: hypothyroidism, now off rx.  : recheck today MNG: we discussed.  Pt declines f/u US, due to cost  Patient Instructions  Blood tests are requested for you today.  We'll let you know about the results.  Please come back for a follow-up appointment in 1 year.   ???????????????????????????????????????????? ??????????????????????????? ??????????????????????????????????????????? ? ??????

## 2018-05-05 NOTE — Patient Instructions (Addendum)
Blood tests are requested for you today.  We'll let you know about the results.  Please come back for a follow-up appointment in 1 year.    

## 2019-05-07 ENCOUNTER — Ambulatory Visit
Admission: RE | Admit: 2019-05-07 | Discharge: 2019-05-07 | Disposition: A | Discharge status: Home / Self Care | Payer: BC Managed Care – PPO | Source: Ambulatory Visit | Attending: Endocrinology | Admitting: Endocrinology

## 2019-05-07 ENCOUNTER — Telehealth: Payer: Self-pay

## 2019-05-07 ENCOUNTER — Encounter: Payer: Self-pay | Admitting: Endocrinology

## 2019-05-07 ENCOUNTER — Other Ambulatory Visit: Payer: Self-pay

## 2019-05-07 ENCOUNTER — Ambulatory Visit (INDEPENDENT_AMBULATORY_CARE_PROVIDER_SITE_OTHER): Payer: BC Managed Care – PPO | Admitting: Endocrinology

## 2019-05-07 VITALS — BP 120/80 | HR 80 | Ht 61.5 in | Wt 140.2 lb

## 2019-05-07 DIAGNOSIS — E042 Nontoxic multinodular goiter: Secondary | ICD-10-CM

## 2019-05-07 LAB — TSH: TSH: 1.48 u[IU]/mL (ref 0.35–4.50)

## 2019-05-07 MED ORDER — LEVOTHYROXINE SODIUM 25 MCG PO TABS: 25.0000 ug | ORAL_TABLET | Freq: Every day | ORAL | 3 refills | Status: DC

## 2019-05-07 NOTE — Patient Instructions (Signed)
Blood tests are requested for you today.  We'll let you know about the results.  Let's recheck the ultrasound.  you will receive a phone call, about a day and time for an appointment. Your require less than a full daily replacement amount of thyroid hormone.  This means that the thyroid was not completely destroyed by the radioactive iodine.  Therefore you have some risk that the overactivity could come back.  this may take many years to happen.  If this happens, you would first notice that your medication requirement would decrease to none.  Then the overactivity would come back.  We'll follow your blood tests for this.   Please come back for a follow-up appointment in 1 year.     ???????????????????????????????????????????? ??????????????????????????? ?????????????????? ????????????????????????????????????????????????????????????????? ????????????????????????????????????????????????????????????? ????????????????????????????????????????????????????????????????????????????? ?????????????????????????????????????????????????? ???????????????????????????????????? ??????????????????????????????????????????????????????????????????????? ?????????????????????????????????? ????????????????????????????????????????????????? ??????????????????????????????????????????? ? ??????.

## 2019-05-07 NOTE — Progress Notes (Signed)
Subjective:    Patient ID: [name], female    DOB: 1975-05-01, 44 y.o.   MRN: [id number]  HPI Pt returns for f/u of multinodular goiter (she had RAI in 2011; she became euthyroid, and stayed euthyroid off any rx, until she needed synthroid in 2018; she has not had F/U US, due to cost; she is not at risk for pregnancy).  pt states she feels well in general.  She takes synthroid as rx'ed.  She does not notice the goiter.   Past Medical History:  Diagnosis Date  . Asthma   . Iron (Fe) deficiency anemia   . Multinodular goiter      Social History   Socioeconomic History  . Marital status: Married    Spouse name: Not on file  . Number of children: Not on file  . Years of education: Not on file  . Highest education level: Not on file  Occupational History  . Not on file  Tobacco Use  . Smoking status: Never Smoker  . Smokeless tobacco: Never Used  Substance and Sexual Activity  . Alcohol use: Yes    Alcohol/week: 0.0 standard drinks  . Drug use: Not on file  . Sexual activity: Not on file  Other Topics Concern  . Not on file  Social History Narrative  . Not on file   Social Determinants of Health   Financial Resource Strain:   . Difficulty of Paying Living Expenses: Not on file  Food Insecurity:   . Worried About Charity fundraiser in the Last Year: Not on file  . Ran Out of Food in the Last Year: Not on file  Transportation Needs:   . Lack of Transportation (Medical): Not on file  . Lack of Transportation (Non-Medical): Not on file  Physical Activity:   . Days of Exercise per Week: Not on file  . Minutes of Exercise per Session: Not on file  Stress:   . Feeling of Stress : Not on file  Social Connections:   . Frequency of Communication with Friends and Family: Not on file  . Frequency of Social Gatherings with Friends and Family: Not on file  . Attends Religious Services: Not on file  . Active Member of Clubs or Organizations: Not on file  . Attends English as a second language teacher Meetings: Not on file  . Marital Status: Not on file  Intimate Partner Violence:   . Fear of Current or Ex-Partner: Not on file  . Emotionally Abused: Not on file  . Physically Abused: Not on file  . Sexually Abused: Not on file    No current outpatient medications on file prior to visit.   No current facility-administered medications on file prior to visit.    No Known Allergies  No family history on file.  BP 120/80 (BP Location: Right Arm, Patient Position: Sitting, Cuff Size: Normal)   Pulse 80   Ht 5' 1.5" (1.562 m)   Wt 140 lb 3.2 oz (63.6 kg)   SpO2 98%   BMI 26.06 kg/m   Review of Systems Denies neck pain    Objective:   Physical Exam VITAL SIGNS:  See vs page GENERAL: no distress NECK: There is no palpable thyroid enlargement.  No thyroid nodule is palpable.  No palpable lymphadenopathy at the anterior neck.    Lab Results  Component Value Date   TSH 1.48 05/07/2019       Assessment & Plan:  Hypothyroidism: well-replaced Goiter: recheck today  Patient Instructions  Blood tests are requested for you today.  We'll let you know about the results.  Let's recheck the ultrasound.  you will receive a phone call, about a day and time for an appointment. Your require less than a full daily replacement amount of thyroid hormone.  This means that the thyroid was not completely destroyed by the radioactive iodine.  Therefore you have some risk that the overactivity could come back.  this may take many years to happen.  If this happens, you would first notice that your medication requirement would decrease to none.  Then the overactivity would come back.  We'll follow your blood tests for this.   Please come back for a follow-up appointment in 1 year.     ???????????????????????????????????????????? ??????????????????????????? ??????????????????  ????????????????????????????????????????????????????????????????? ????????????????????????????????????????????????????????????? ????????????????????????????????????????????????????????????????????????????? ?????????????????????????????????????????????????? ???????????????????????????????????? ??????????????????????????????????????????????????????????????????????? ?????????????????????????????????? ????????????????????????????????????????????????? ??????????????????????????????????????????? ? ??????.

## 2019-05-07 NOTE — Telephone Encounter (Signed)
-----   Message from Romero Belling, MD sent at 05/07/2019  4:06 PM EST ----- please contact patient: Normal.  Please continue the same medication.

## 2019-05-07 NOTE — Telephone Encounter (Signed)
Imaging results reviewed by Dr. Everardo All. Letter has been mailed. For future reference, letter can be found in Epic.

## 2019-05-07 NOTE — Telephone Encounter (Signed)
-----   Message from Romero Belling, MD sent at 05/07/2019  4:07 PM EST ----- please contact patient: Nothing to worry about.  Please come back for a follow-up appointment in 1 year.

## 2019-05-07 NOTE — Telephone Encounter (Signed)
Lab results reviewed by Dr. Ellison. Letter has been mailed. For future reference, letter can be found in Epic. 

## 2019-06-25 ENCOUNTER — Encounter: Payer: Self-pay | Admitting: Plastic Surgery

## 2019-06-25 ENCOUNTER — Other Ambulatory Visit: Payer: Self-pay

## 2019-06-25 ENCOUNTER — Ambulatory Visit (INDEPENDENT_AMBULATORY_CARE_PROVIDER_SITE_OTHER): Payer: BC Managed Care – PPO | Admitting: Plastic Surgery

## 2019-06-25 VITALS — BP 132/81 | HR 72 | Temp 97.8°F | Ht 62.0 in | Wt 142.4 lb

## 2019-06-25 DIAGNOSIS — L723 Sebaceous cyst: Secondary | ICD-10-CM | POA: Diagnosis not present

## 2019-06-25 MED ORDER — SULFAMETHOXAZOLE-TRIMETHOPRIM 800-160 MG PO TABS: 1.0000 | ORAL_TABLET | Freq: Two times a day (BID) | ORAL | 0 refills | Status: DC

## 2019-06-25 NOTE — Progress Notes (Signed)
   Referring Provider Deatra James, MD [phone] [place],  [place] [address]   CC:  Chief Complaint  Patient presents with  . Advice Only    for growing skin lesion on (R) side of cheek      [name] is an 44 y.o. female.  HPI: Patient presents with a bothersome skin lesion of her right cheek.  Is been present for some time but over the past couple weeks has gotten larger and more red.  Has not drained or been treated before according to her.  Her primary physician sent her to me to have it removed.  No Known Allergies  Outpatient Encounter Medications as of 06/25/2019  Medication Sig  . escitalopram (LEXAPRO) 10 MG tablet Take 10 mg by mouth daily.  Marland Kitchen levothyroxine (SYNTHROID) 25 MCG tablet Take 1 tablet (25 mcg total) by mouth daily.  . simvastatin (ZOCOR) 20 MG tablet Take 20 mg by mouth daily after supper.   No facility-administered encounter medications on file as of 06/25/2019.     Past Medical History:  Diagnosis Date  . Asthma   . Iron (Fe) deficiency anemia   . Multinodular goiter    No family history on file.  Social History   Social History Narrative  . Not on file     Review of Systems General: Denies fevers, chills, weight loss CV: Denies chest pain, shortness of breath, palpitations  Physical Exam Vitals with BMI 06/25/2019 05/07/2019 05/05/2018  Height 5\' 2"  5' 1.5" 5' 1.5"  Weight 142 lbs 6 oz 140 lbs 3 oz 133 lbs 6 oz  BMI 26.04 26.06 24.8  Systolic 132 120  Diastolic 81 80 64  Pulse 72 80 72    General:  No acute distress,  Alert and oriented, Non-Toxic, Normal speech and affect HEENT: Normocephalic atraumatic.  Extraocular movements intact.  Cranial nerves grossly intact.  In the right mandibular angle there is a 2.5 cm erythematous swollen nodule.  It looks like an inflamed sebaceous cyst.  Assessment/Plan Patient presents with what looks like an inflamed sebaceous cyst at the right mandibular angle.  We will plan to  treat her with antibiotics conservatively.  I explained if this drained spontaneously or the inflammation decreases then she will have a smaller scar on excision.  She voiced understanding and I will plan to follow-up with her again in 2 weeks.  409 06/25/2019, 11:41 AM

## 2019-06-28 ENCOUNTER — Telehealth: Payer: Self-pay | Admitting: Plastic Surgery

## 2019-06-28 NOTE — Telephone Encounter (Signed)
Spoke with patient's husband.  He reports the cyst on her cheek does not hurt (no excision). Reports her neck hurts, she has dizziness, upset stomach, and fatigue. Reports she has been taking her ABX with meals. He's concerned that the cyst has not drained on its own yet.  They may do warm compress on the cyst. May use ibuprofen. With her other symptoms, recommend seeing their PCP for evaluation.

## 2019-06-28 NOTE — Telephone Encounter (Signed)
Patient's husband called to advise that wife has pain at the excision site and feels dizzy and fatigued. No drainage. Please call husband to advise of what to do.

## 2019-07-12 ENCOUNTER — Other Ambulatory Visit: Payer: Self-pay

## 2019-07-12 ENCOUNTER — Encounter: Payer: Self-pay | Admitting: Plastic Surgery

## 2019-07-12 ENCOUNTER — Ambulatory Visit (INDEPENDENT_AMBULATORY_CARE_PROVIDER_SITE_OTHER): Payer: BC Managed Care – PPO | Admitting: Plastic Surgery

## 2019-07-12 VITALS — BP 118/75 | HR 79 | Temp 97.8°F | Ht 62.0 in | Wt 142.0 lb

## 2019-07-12 DIAGNOSIS — L723 Sebaceous cyst: Secondary | ICD-10-CM | POA: Diagnosis not present

## 2019-07-12 NOTE — Progress Notes (Signed)
   Referring Provider Deatra James, MD [phone] [place],  [place] [address]   CC:  Chief Complaint  Patient presents with  . Follow-up    2 weeks for sebaceous cyst (R) side of cheek      [name] is an 44 y.o. female.  HPI: Patient presents for follow-up for sebaceous cyst in the right cheek.  It appeared erythematous and inflamed at her last visit so I gave her a course of antibiotics.  She feels that it looks better now and is hopeful this can be excised in the near future.  Review of Systems General: Denies fevers, chills  Physical Exam Vitals with BMI 07/12/2019 06/25/2019 05/07/2019  Height 5\' 2"  5\' 2"  5' 1.5"  Weight 142 lbs 142 lbs 6 oz 140 lbs 3 oz  BMI 25.97 26.04 26.06  Systolic 118 132  Diastolic 75 81 80  Pulse 79 72 80    General:  No acute distress,  Alert and oriented, Non-Toxic, Normal speech and affect The cyst on her right cheek is a bit smaller and less erythematous.  The overlying skin is thin and its about a centimeter and a half long by a centimeter wide.  Assessment/Plan I think we can move forward with planning local excision of this.  We discussed the risk include bleeding, infection, demonstrating structures, need for additional procedures.  We will set this up for 30-minute local in the office.  All her questions were answered.  07/12/2019, 10:30 AM

## 2019-08-16 ENCOUNTER — Encounter: Payer: Self-pay | Admitting: Plastic Surgery

## 2019-08-16 ENCOUNTER — Other Ambulatory Visit: Payer: Self-pay

## 2019-08-16 ENCOUNTER — Other Ambulatory Visit (HOSPITAL_COMMUNITY)
Admission: RE | Admit: 2019-08-16 | Discharge: 2019-08-16 | Disposition: A | Discharge status: Home / Self Care | Payer: BC Managed Care – PPO | Source: Ambulatory Visit | Attending: Plastic Surgery | Admitting: Plastic Surgery

## 2019-08-16 ENCOUNTER — Ambulatory Visit (INDEPENDENT_AMBULATORY_CARE_PROVIDER_SITE_OTHER): Payer: BC Managed Care – PPO | Admitting: Plastic Surgery

## 2019-08-16 VITALS — BP 103/72 | HR 74 | Temp 98.1°F | Ht 62.0 in | Wt 141.0 lb

## 2019-08-16 DIAGNOSIS — L723 Sebaceous cyst: Secondary | ICD-10-CM | POA: Insufficient documentation

## 2019-08-16 NOTE — Progress Notes (Signed)
Operative Note   DATE OF OPERATION: 08/16/2019  LOCATION:    SURGICAL DEPARTMENT: Plastic Surgery  PREOPERATIVE DIAGNOSES:  Right cheek cyst  POSTOPERATIVE DIAGNOSES:  same  PROCEDURE:  1. Excision of right cheek cyst measuring 2.5 cm 2. Complex closure measuring 2.5 cm  SURGEON: Ancil Linsey, MD  ANESTHESIA:  Local  COMPLICATIONS: None.   INDICATIONS FOR PROCEDURE:  The patient, [name] is a 44 y.o. female born on Jan 25, 1976, is here for treatment of right cheek cyst MRN: [id number]  CONSENT:  Informed consent was obtained directly from the patient. Risks, benefits and alternatives were fully discussed. Specific risks including but not limited to bleeding, infection, hematoma, seroma, scarring, pain, infection, wound healing problems, and need for further surgery were all discussed. The patient did have an ample opportunity to have questions answered to satisfaction.   DESCRIPTION OF PROCEDURE:  Local anesthesia was administered. The patient's operative site was prepped and draped in a sterile fashion. A time out was performed and all information was confirmed to be correct.  The lesion was excised with a 15 blade.  Hemostasis was obtained.  Circumferential undermining was performed and the skin was advanced and closed in layers with interrupted buried Monocryl sutures and 5-0 fast gut for the skin.   The patient tolerated the procedure well.  There were no complications.

## 2019-08-20 ENCOUNTER — Telehealth: Payer: Self-pay | Admitting: Plastic Surgery

## 2019-08-20 LAB — SURGICAL PATHOLOGY

## 2019-08-20 NOTE — Telephone Encounter (Signed)
Patient's daughter called to advise that her mom is still in pain and notified her employer that she will need to be out of work for another week or so. Her employer will be contacting us to get more information to support this request. Daughter was giving heads up.

## 2019-08-22 ENCOUNTER — Ambulatory Visit (INDEPENDENT_AMBULATORY_CARE_PROVIDER_SITE_OTHER): Payer: BC Managed Care – PPO | Admitting: Plastic Surgery

## 2019-08-22 ENCOUNTER — Other Ambulatory Visit: Payer: Self-pay

## 2019-08-22 ENCOUNTER — Encounter: Payer: Self-pay | Admitting: Plastic Surgery

## 2019-08-22 VITALS — BP 114/79 | HR 77 | Temp 98.9°F

## 2019-08-22 DIAGNOSIS — L723 Sebaceous cyst: Secondary | ICD-10-CM

## 2019-08-22 NOTE — Progress Notes (Signed)
Patient presents 1 week postop from excision of a cyst in the right cheek.  She is having some pain in that area and wanted to be evaluated.  On exam the skin looks like it is healed nicely.  There is no swelling or bruising.  She has normal facial nerve function and no subcutaneous fluid collections.  There is no sign of infection.  I explained I could not provide any stronger pain medication based on the objective findings.  I did tell her she could continue to take Tylenol or potentially add ibuprofen but that I cannot prescribe her anything stronger.  She also asked about staying out of work and I do not think keeping her out of work any further is indicated at this point.  Everything looks to be healing fine.  We will plan to see her again as needed.  Her pathology was reviewed and was consistent with a benign epidermal inclusion cyst.

## 2019-08-30 NOTE — Progress Notes (Deleted)
Patient is a 44 year old female here for follow-up after undergoing excision of right cheek cyst (2.5 cm) on 08/16/2019 with Dr. Arita Miss.  At her last visit on 08/22/2019 the skin was healing nicely.  Normal facial nerve function.  Her pathology was reviewed and was consistent with a benign epidermal inclusion cyst.  ~ 2 weeks PO  She was informed based on medical findings that we could not provide any stronger pain medication nor was staying out of work indicated. Use tylenol & ibuprofen

## 2019-08-31 ENCOUNTER — Ambulatory Visit: Payer: BC Managed Care – PPO | Admitting: Plastic Surgery

## 2020-05-07 ENCOUNTER — Ambulatory Visit: Payer: BC Managed Care – PPO | Admitting: Endocrinology

## 2020-05-30 ENCOUNTER — Other Ambulatory Visit: Payer: Self-pay | Admitting: Endocrinology

## 2020-08-21 ENCOUNTER — Other Ambulatory Visit: Payer: Self-pay | Admitting: Endocrinology

## 2020-10-08 ENCOUNTER — Telehealth: Payer: Self-pay | Admitting: Endocrinology

## 2020-10-08 DIAGNOSIS — E042 Nontoxic multinodular goiter: Secondary | ICD-10-CM

## 2020-10-08 DIAGNOSIS — E890 Postprocedural hypothyroidism: Secondary | ICD-10-CM

## 2020-10-08 NOTE — Telephone Encounter (Signed)
MEDICATION: Levothyroxine Sodium 25 MCG  PHARMACY:  [facility] on [address]  HAS THE PATIENT CONTACTED THEIR PHARMACY?  yes  IS PATIENT OUT OF MEDICATION: yes  IF NOT; HOW MUCH IS LEFT: none  LAST APPOINTMENT DATE: @03 /03/2019  NEXT APPOINTMENT DATE:@10 /09/2020  DO WE HAVE YOUR PERMISSION TO LEAVE A DETAILED MESSAGE?: yes   OTHER COMMENTS: Appointment scheduled for 12/12/20

## 2020-10-17 NOTE — Telephone Encounter (Signed)
Please see request from 10/08/20 for a refill for Levothyroxine Sodium 25 MCG

## 2020-10-20 ENCOUNTER — Telehealth: Payer: Self-pay

## 2020-10-20 NOTE — Telephone Encounter (Signed)
Please contact pt to schedule an OV. Pt has not been seen since 3/21. I will grant just a 30 day supply once appt has been made for thyroid medication

## 2020-10-24 ENCOUNTER — Other Ambulatory Visit: Payer: Self-pay

## 2020-10-24 ENCOUNTER — Emergency Department (HOSPITAL_COMMUNITY)
Admission: EM | Admit: 2020-10-24 | Discharge: 2020-10-24 | Disposition: A | Discharge status: Home / Self Care | Payer: BC Managed Care – PPO | Attending: Emergency Medicine | Admitting: Emergency Medicine

## 2020-10-24 ENCOUNTER — Encounter (HOSPITAL_COMMUNITY): Payer: Self-pay

## 2020-10-24 DIAGNOSIS — W540XXA Bitten by dog, initial encounter: Secondary | ICD-10-CM | POA: Diagnosis not present

## 2020-10-24 DIAGNOSIS — Z79899 Other long term (current) drug therapy: Secondary | ICD-10-CM | POA: Diagnosis not present

## 2020-10-24 DIAGNOSIS — E039 Hypothyroidism, unspecified: Secondary | ICD-10-CM | POA: Diagnosis not present

## 2020-10-24 DIAGNOSIS — Z23 Encounter for immunization: Secondary | ICD-10-CM | POA: Insufficient documentation

## 2020-10-24 DIAGNOSIS — Z203 Contact with and (suspected) exposure to rabies: Secondary | ICD-10-CM | POA: Insufficient documentation

## 2020-10-24 DIAGNOSIS — S31825A Open bite of left buttock, initial encounter: Secondary | ICD-10-CM | POA: Insufficient documentation

## 2020-10-24 DIAGNOSIS — Z2914 Encounter for prophylactic rabies immune globin: Secondary | ICD-10-CM | POA: Diagnosis not present

## 2020-10-24 DIAGNOSIS — J45909 Unspecified asthma, uncomplicated: Secondary | ICD-10-CM | POA: Insufficient documentation

## 2020-10-24 MED ORDER — RABIES IMMUNE GLOBULIN 150 UNIT/ML IM INJ
20.0000 [IU]/kg | INJECTION | Freq: Once | INTRAMUSCULAR | Status: AC
  Administered 2020-10-24: 1275 [IU] via INTRAMUSCULAR
  Filled 2020-10-24: qty 10

## 2020-10-24 MED ORDER — AMOXICILLIN-POT CLAVULANATE 875-125 MG PO TABS
1.0000 | ORAL_TABLET | Freq: Once | ORAL | Status: AC
  Administered 2020-10-24: 1 via ORAL
  Filled 2020-10-24: qty 1

## 2020-10-24 MED ORDER — AMOXICILLIN-POT CLAVULANATE 875-125 MG PO TABS: 1.0000 | ORAL_TABLET | Freq: Two times a day (BID) | ORAL | 0 refills | Status: DC

## 2020-10-24 MED ORDER — RABIES VACCINE, PCEC IM SUSR
1.0000 mL | Freq: Once | INTRAMUSCULAR | Status: AC
  Administered 2020-10-24: 1 mL via INTRAMUSCULAR
  Filled 2020-10-24: qty 1

## 2020-10-24 NOTE — ED Triage Notes (Signed)
Patient was bit in the left buttock by an unknown dog at 1100 today. Skin is broken.

## 2020-10-24 NOTE — Discharge Instructions (Signed)
Please read and follow all provided instructions.  Your diagnoses today include:  1. Dog bite, initial encounter     Tests performed today include: Vital signs. See below for your results today.   Medications prescribed:  Augmentin - antibiotic  You have been prescribed an antibiotic medicine: take the entire course of medicine even if you are feeling better. Stopping early can cause the antibiotic not to work.  Take any prescribed medications only as directed.   Home care instructions:  Follow any educational materials contained in this packet. Keep affected area above the level of your heart when possible. Wash area gently twice a day with warm soapy water. Do not apply alcohol or hydrogen peroxide. Cover the area if it draining or weeping.   Follow-up instructions: Go to urgent care on 10/25/2020 for your second vaccine.  You will also need a vaccine on 8/26 and 9/2.  Return instructions:  Return to the Emergency Department if you have: Fever Worsening symptoms Worsening pain Worsening swelling Redness of the skin that moves away from the affected area, especially if it streaks away from the affected area  Any other emergent concerns  Your vital signs today were: BP 131/82 (BP Location: Left Arm)   Pulse 72   Temp 98 F (36.7 C) (Oral)   Resp 18   Ht 5\' 2"  (1.575 m)   Wt 63 kg   LMP 10/06/2020   SpO2 96%   BMI 25.42 kg/m  If your blood pressure (BP) was elevated above 135/85 this visit, please have this repeated by your doctor within one month. --------------

## 2020-10-24 NOTE — ED Provider Notes (Signed)
[facility]   CSN: [id number] Arrival date & time: 10/24/20  1617     History Chief Complaint  Patient presents with   Animal Bite    [name] is a 45 y.o. female.  Patient presents to the emergency department for evaluation of a dog bite.  Patient was bitten in the left gluteal area by an unknown dog at 11 AM today.  Dog is unknown and at large.  She cleaned the wound well at home.  Tetanus up-to-date.  No other injuries.  Here for consideration of rabies prophylaxis.      Past Medical History:  Diagnosis Date   Asthma    Iron (Fe) deficiency anemia    Multinodular goiter     Patient Active Problem List   Diagnosis Date Noted   Hypothyroidism 06/24/2016   Multinodular goiter    Asthma    Iron (Fe) deficiency anemia    GOITER, MULTINODULAR 11/22/2006    History reviewed. No pertinent surgical history.   OB History   No obstetric history on file.     Family History  Family history unknown: Yes    Social History   Tobacco Use   Smoking status: Never   Smokeless tobacco: Never  Vaping Use   Vaping Use: Never used  Substance Use Topics   Alcohol use: Yes    Alcohol/week: 0.0 standard drinks   Drug use: Never    Home Medications Prior to Admission medications   Medication Sig Start Date End Date Taking? Authorizing Provider  escitalopram (LEXAPRO) 10 MG tablet Take 10 mg by mouth daily.    [provider]  levothyroxine (SYNTHROID) 25 MCG tablet Take 1 tablet (25 mcg total) by mouth daily. 05/07/19   Romero Belling, MD  simvastatin (ZOCOR) 20 MG tablet Take 20 mg by mouth daily after supper.    [provider]    Allergies    Patient has no known allergies.  Review of Systems   Review of Systems  Constitutional:  Negative for fever.  Musculoskeletal:  Positive for myalgias.  Skin:  Positive for wound.   Physical Exam Updated Vital Signs BP 131/82 (BP Location: Left Arm)    Pulse 72   Temp 98 F (36.7 C) (Oral)   Resp 18   Ht 5\' 2"  (1.575 m)   Wt 63 kg   LMP 10/06/2020   SpO2 96%   BMI 25.42 kg/m   Physical Exam Vitals and nursing note reviewed.  Constitutional:      Appearance: She is well-developed.  HENT:     Head: Normocephalic and atraumatic.  Eyes:     Conjunctiva/sclera: Conjunctivae normal.  Pulmonary:     Effort: No respiratory distress.  Musculoskeletal:     Cervical back: Normal range of motion and neck supple.     Comments: There are several scratch marks and a small area of broken skin to the mid left lateral buttocks.  No deep laceration or tearing of tissue.  Skin:    General: Skin is warm and dry.  Neurological:     Mental Status: She is alert.    ED Results / Procedures / Treatments   Labs (all labs ordered are listed, but only abnormal results are displayed) Labs Reviewed - No data to display  EKG None  Radiology No results found.  Procedures Procedures   Medications Ordered in ED Medications  rabies immune globulin (HYPERAB/KEDRAB) injection 1,275 Units (has no administration in time range)  rabies  vaccine (RABAVERT) injection 1 mL (has no administration in time range)    ED Course  I have reviewed the triage vital signs and the nursing notes.  Pertinent labs & imaging results that were available during my care of the patient were reviewed by me and considered in my medical decision making (see chart for details).  Patient seen and examined.  Wound itself is very clean.  Tetanus up-to-date per patient.  Will give rabies immunoglobulin and vaccine.  Discussed series and next vaccine required on 10/27/2020.  Vital signs reviewed and are as follows: BP 131/82 (BP Location: Left Arm)   Pulse 72   Temp 98 F (36.7 C) (Oral)   Resp 18   Ht 5\' 2"  (1.575 m)   Wt 63 kg   LMP 10/06/2020   SpO2 96%   BMI 25.42 kg/m   8:40 PM Home on Augmentin.   Pt urged to return with worsening pain, worsening swelling,  expanding area of redness or streaking up extremity, fever, or any other concerns. Urged to take complete course of antibiotics as prescribed.  Pt verbalizes understanding and agrees with plan.    MDM Rules/Calculators/A&P                           Animal bite: Rabies prophylaxis, antibiotic prophylaxis, tetanus up-to-date.  Follow-up as above.   Final Clinical Impression(s) / ED Diagnoses Final diagnoses:  Dog bite, initial encounter    Rx / DC Orders ED Discharge Orders          Ordered    amoxicillin-clavulanate (AUGMENTIN) 875-125 MG tablet  Every 12 hours        10/24/20 2037             2038, PA-C 10/24/20 2040    2041, MD 10/25/20 1213

## 2020-10-31 ENCOUNTER — Telehealth: Payer: Self-pay | Admitting: Endocrinology

## 2020-10-31 MED ORDER — LEVOTHYROXINE SODIUM 25 MCG PO TABS: 25.0000 ug | ORAL_TABLET | Freq: Every day | ORAL | 0 refills | Status: DC

## 2020-10-31 NOTE — Telephone Encounter (Signed)
Rx sent to preferred pharmacy.

## 2020-10-31 NOTE — Telephone Encounter (Signed)
Patient's husband Viviani called again to request the following RX:  MEDICATION: levothyroxine (SYNTHROID) 25 MCG tablet  PHARMACY:    [id number] - [place], [place] - [address] AT [facility] OF [address] Phone:  [phone]  Fax:  [phone]     HAS THE PATIENT CONTACTED THEIR PHARMACY?  No  IS THIS A 90 DAY SUPPLY : Yes for 2 months  IS PATIENT OUT OF MEDICATION: Yes-[place] states PHARM has been sending requests for 2 months with no response  IF NOT; HOW MUCH IS LEFT: 0  LAST APPOINTMENT DATE: @8 /15/2022  NEXT APPOINTMENT DATE:@10 /09/2020  DO WE HAVE YOUR PERMISSION TO LEAVE A DETAILED MESSAGE?: Yes  OTHER COMMENTS:    **Let patient know to contact pharmacy at the end of the day to make sure medication is ready. **  ** Please notify patient to allow 48-72 hours to process**  **Encourage patient to contact the pharmacy for refills or they can request refills through [facility]**

## 2020-10-31 NOTE — Addendum Note (Signed)
Addended by: Kenyon Ana on: 10/31/2020 03:54 PM   Modules accepted: Orders

## 2020-11-03 NOTE — Telephone Encounter (Signed)
NA

## 2020-12-12 ENCOUNTER — Ambulatory Visit: Payer: BC Managed Care – PPO | Admitting: Endocrinology

## 2021-03-13 ENCOUNTER — Ambulatory Visit (INDEPENDENT_AMBULATORY_CARE_PROVIDER_SITE_OTHER): Payer: BC Managed Care – PPO | Admitting: Endocrinology

## 2021-03-13 ENCOUNTER — Other Ambulatory Visit: Payer: Self-pay

## 2021-03-13 VITALS — BP 124/78 | HR 67 | Ht 62.0 in | Wt 140.0 lb

## 2021-03-13 DIAGNOSIS — E890 Postprocedural hypothyroidism: Secondary | ICD-10-CM | POA: Diagnosis not present

## 2021-03-13 DIAGNOSIS — E042 Nontoxic multinodular goiter: Secondary | ICD-10-CM

## 2021-03-13 LAB — TSH: TSH: 4.2 u[IU]/mL (ref 0.35–5.50)

## 2021-03-13 NOTE — Progress Notes (Signed)
° °  Subjective:    Patient ID: [name], female    DOB: December 21, 1975, 46 y.o.   MRN: [id number]  HPI Pt returns for f/u of multinodular goiter (she had RAI in 2011; she became euthyroid, and stayed euthyroid off any rx, until she needed synthroid in 2018; she has not had F/U US, due to cost; she is not at risk for pregnancy).  pt states she feels well in general.  She takes synthroid as rx'ed.  She does not notice the goiter.   Past Medical History:  Diagnosis Date   Asthma    Iron (Fe) deficiency anemia    Multinodular goiter     No past surgical history on file.  Social History   Socioeconomic History   Marital status: Married    Spouse name: Not on file   Number of children: Not on file   Years of education: Not on file   Highest education level: Not on file  Occupational History   Not on file  Tobacco Use   Smoking status: Never   Smokeless tobacco: Never  Vaping Use   Vaping Use: Never used  Substance and Sexual Activity   Alcohol use: Yes    Alcohol/week: 0.0 standard drinks   Drug use: Never   Sexual activity: Not on file  Other Topics Concern   Not on file  Social History Narrative   Not on file   Social Determinants of Health   Financial Resource Strain: Not on file  Food Insecurity: Not on file  Transportation Needs: Not on file  Physical Activity: Not on file  Stress: Not on file  Social Connections: Not on file  Intimate Partner Violence: Not on file    Current Outpatient Medications on File Prior to Visit  Medication Sig Dispense Refill   amoxicillin-clavulanate (AUGMENTIN) 875-125 MG tablet Take 1 tablet by mouth every 12 (twelve) hours. 9 tablet 0   escitalopram (LEXAPRO) 10 MG tablet Take 10 mg by mouth daily.     simvastatin (ZOCOR) 20 MG tablet Take 20 mg by mouth daily after supper.     No current facility-administered medications on file prior to visit.    No Known Allergies  Family History  Family history unknown: Yes    BP  124/78    Pulse 67    Ht 5\' 2"  (1.575 m)    Wt 140 lb (63.5 kg)    SpO2 98%    BMI 25.61 kg/m    Review of Systems     Objective:   Physical Exam VITAL SIGNS:  See vs page GENERAL: no distress NECK: thyroid is slightly enlarged, with irreg surface, but no palpable nodule.   Lab Results  Component Value Date   TSH 4.20 03/13/2021      Assessment & Plan:  Hypothyroidism: well-controlled.  Please continue the same synthroid

## 2021-03-13 NOTE — Patient Instructions (Addendum)
Blood tests are requested for you today.  We'll let you know about the results.  Let's recheck the ultrasound.  you will receive a phone call, about a day and time for an appointment. Please come back for a follow-up appointment in 1 year.    

## 2021-03-14 MED ORDER — LEVOTHYROXINE SODIUM 25 MCG PO TABS: 25.0000 ug | ORAL_TABLET | Freq: Every day | ORAL | 3 refills | Status: AC

## 2021-06-29 ENCOUNTER — Ambulatory Visit (INDEPENDENT_AMBULATORY_CARE_PROVIDER_SITE_OTHER): Payer: BC Managed Care – PPO | Admitting: Endocrinology

## 2021-06-29 VITALS — BP 122/82 | HR 73 | Ht 62.0 in | Wt 140.2 lb

## 2021-06-29 DIAGNOSIS — E890 Postprocedural hypothyroidism: Secondary | ICD-10-CM | POA: Diagnosis not present

## 2021-06-29 LAB — TSH: TSH: 3.1 u[IU]/mL (ref 0.35–5.50)

## 2021-06-29 LAB — T4, FREE: Free T4: 0.78 ng/dL (ref 0.60–1.60)

## 2021-06-29 NOTE — Patient Instructions (Addendum)
Blood tests are requested for you today.  We'll let you know about the results.   ?You could have an endocrinology follow-up appointment in 1 year, or Dr Wynelle Link would be happy to do your annual thyroid blood test and prescription.   ?

## 2021-06-29 NOTE — Progress Notes (Signed)
? ?  Subjective:  ? ? Patient ID: [name], female    DOB: 18-Oct-1975, 46 y.o.   MRN: [id number] ? ?HPI ?Pt returns for f/u of multinodular goiter (she had RAI in 2011; she became euthyroid, and stayed euthyroid off any rx, until she needed synthroid in 2018; [place] in 2021 said no f/u US was needed; she is not at risk for pregnancy).  pt states she feels well in general.  She takes synthroid as rx'ed.   ?Past Medical History:  ?Diagnosis Date  ? Asthma   ? Iron (Fe) deficiency anemia   ? Multinodular goiter   ? ? ?No past surgical history on file. ? ?Social History  ? ?Socioeconomic History  ? Marital status: Married  ?  Spouse name: Not on file  ? Number of children: Not on file  ? Years of education: Not on file  ? Highest education level: Not on file  ?Occupational History  ? Not on file  ?Tobacco Use  ? Smoking status: Never  ? Smokeless tobacco: Never  ?Vaping Use  ? Vaping Use: Never used  ?Substance and Sexual Activity  ? Alcohol use: Yes  ?  Alcohol/week: 0.0 standard drinks  ? Drug use: Never  ? Sexual activity: Not on file  ?Other Topics Concern  ? Not on file  ?Social History Narrative  ? Not on file  ? ?Social Determinants of Health  ? ?Financial Resource Strain: Not on file  ?Food Insecurity: Not on file  ?Transportation Needs: Not on file  ?Physical Activity: Not on file  ?Stress: Not on file  ?Social Connections: Not on file  ?Intimate Partner Violence: Not on file  ? ? ?Current Outpatient Medications on File Prior to Visit  ?Medication Sig Dispense Refill  ? amoxicillin-clavulanate (AUGMENTIN) 875-125 MG tablet Take 1 tablet by mouth every 12 (twelve) hours. 9 tablet 0  ? escitalopram (LEXAPRO) 10 MG tablet Take 10 mg by mouth daily.    ? levothyroxine (SYNTHROID) 25 MCG tablet Take 1 tablet (25 mcg total) by mouth daily. 90 tablet 3  ? simvastatin (ZOCOR) 20 MG tablet Take 20 mg by mouth daily after supper.    ? ?No current facility-administered medications on file prior to visit.  ? ? ?No Known  Allergies ? ?Family History  ?Family history unknown: Yes  ? ? ?BP 122/82 (BP Location: Left Arm, Patient Position: Sitting, Cuff Size: Normal)   Pulse 73   Ht 5\' 2"  (1.575 m)   Wt 140 lb 3.2 oz (63.6 kg)   SpO2 98%   BMI 25.64 kg/m?  ? ? ?Review of Systems ? ?   ?Objective:  ? Physical Exam ?VITAL SIGNS:  See vs page ?GENERAL: no distress ?NECK: There is no palpable thyroid enlargement.  No thyroid nodule is palpable.  No palpable lymphadenopathy at the anterior neck.   ? ? ?Lab Results  ?Component Value Date  ? TSH 3.10 06/29/2021  ? ?   ?Assessment & Plan:  ?Hypothyroidism: well-controlled.  Please continue the same synthroid ? ?

## 2022-01-24 IMAGING — US US THYROID
1 series · 13 of 25 positions shown · non-contrast
Comparison: The report from a prior thyroid ultrasound dated
01/03/2003 is available for review.

CLINICAL DATA: Goiter. 43-year-old female with multinodular goiter.

EXAM:
THYROID ULTRASOUND
TECHNIQUE: Ultrasound examination of the thyroid gland and adjacent soft
tissues was performed.

[Series 1: us thyroid · 0.05mm/px · 13 of 49 slices shown]
[im 1/49]
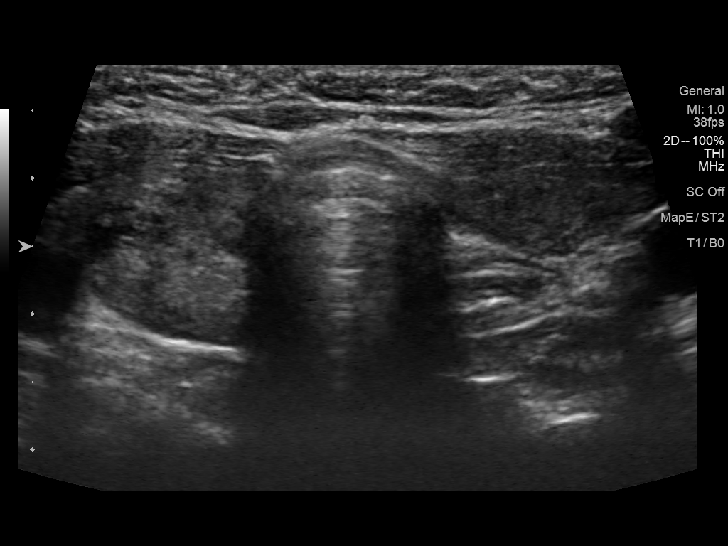
[im 5/49]
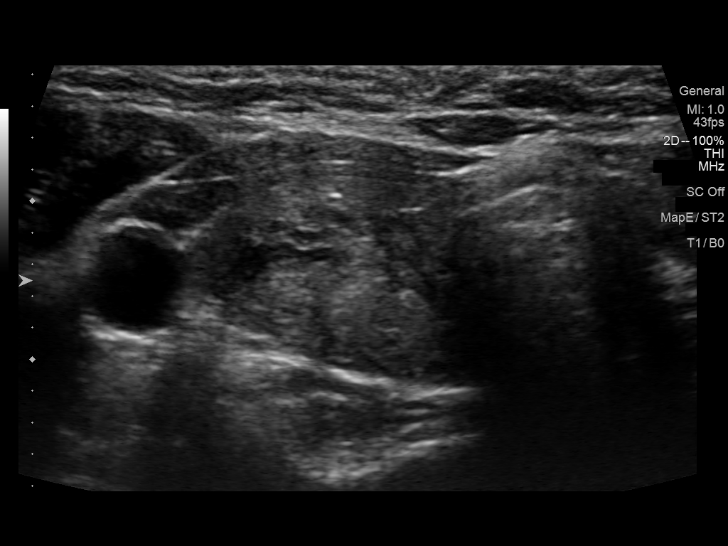
[im 9/49]
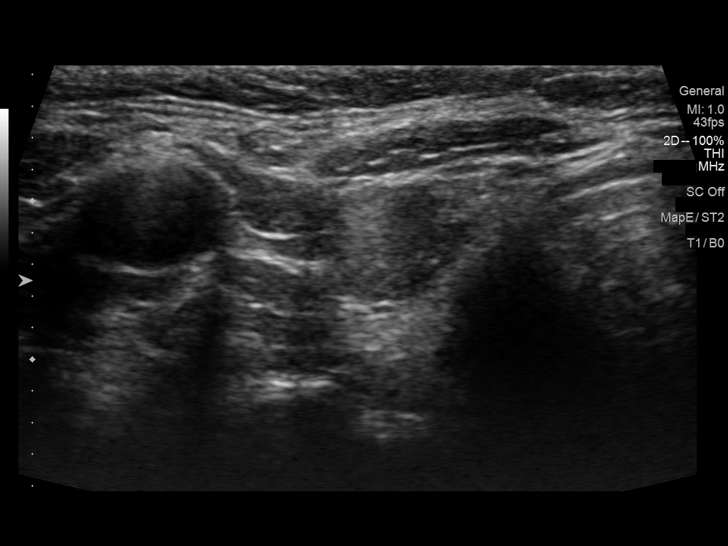
[im 13/49]
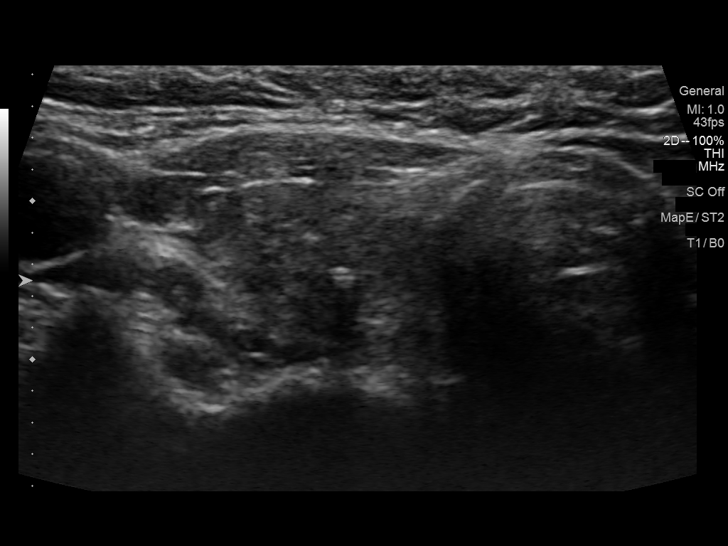
[im 17/49]
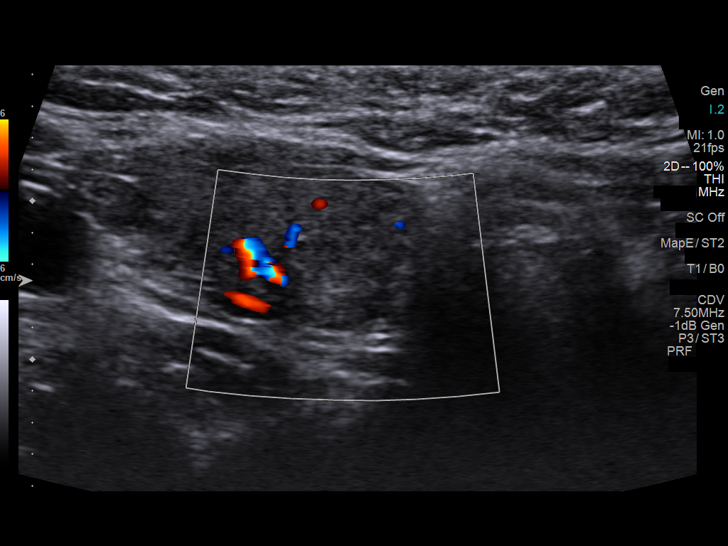
[im 21/49]
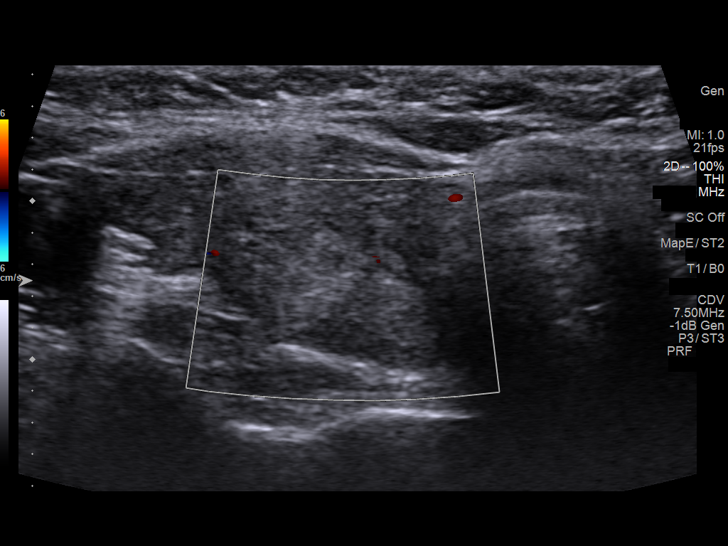
[im 25/49]
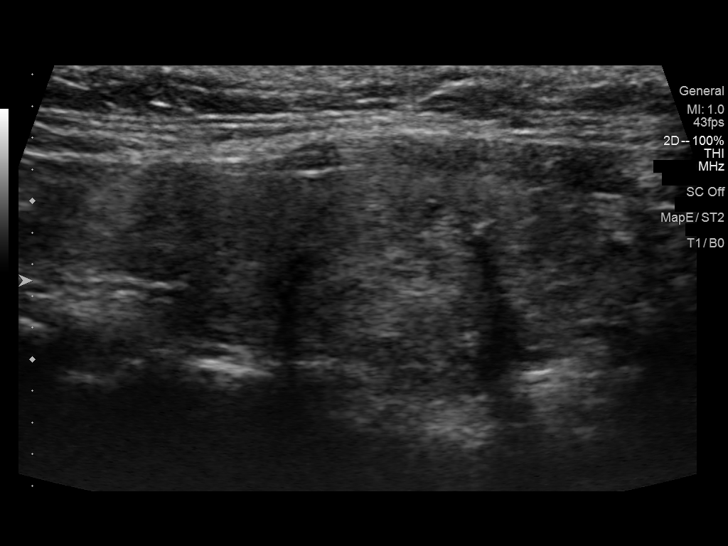
[im 29/49]
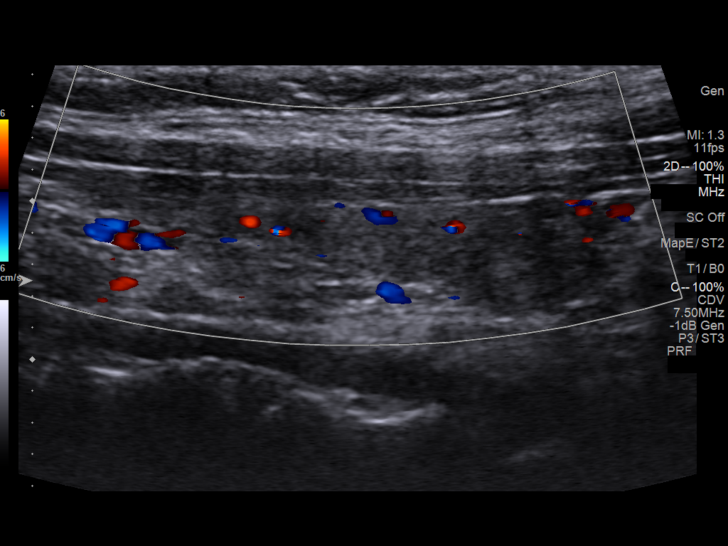
[im 33/49]
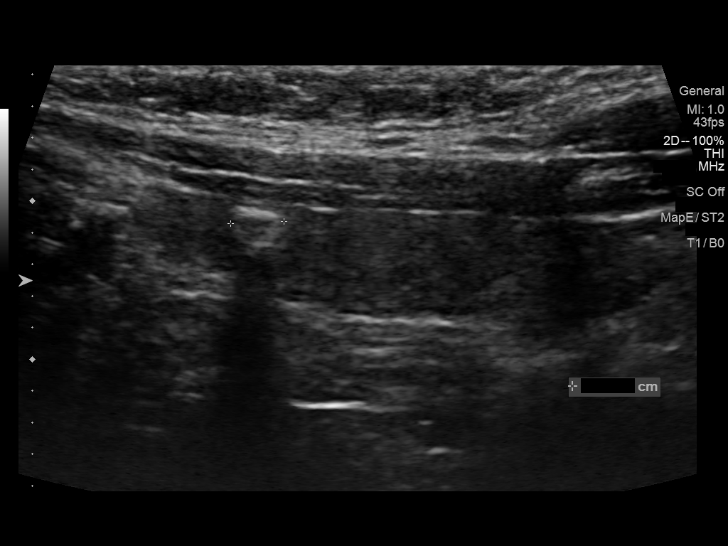
[im 37/49]
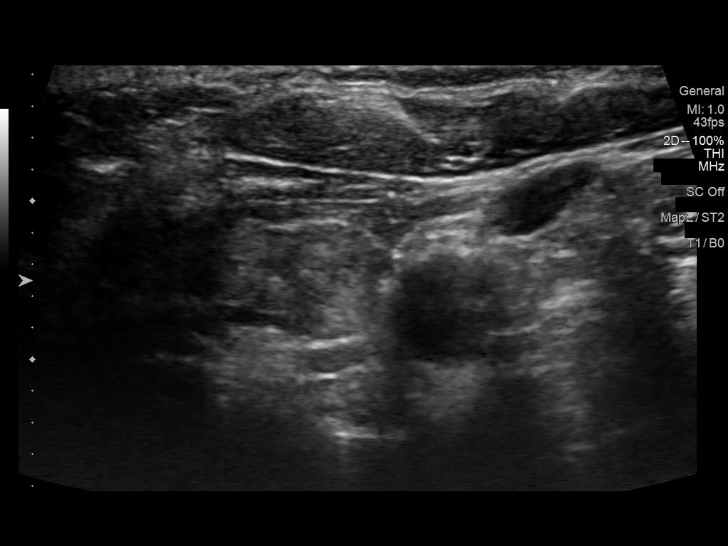
[im 41/49]
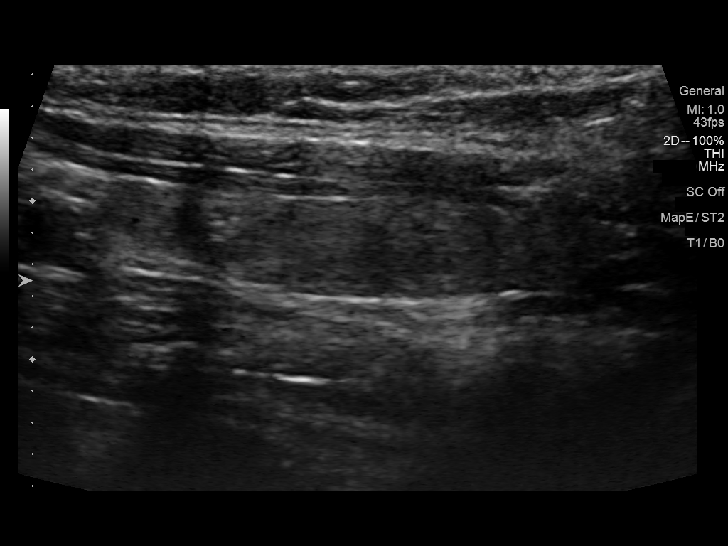
[im 45/49]
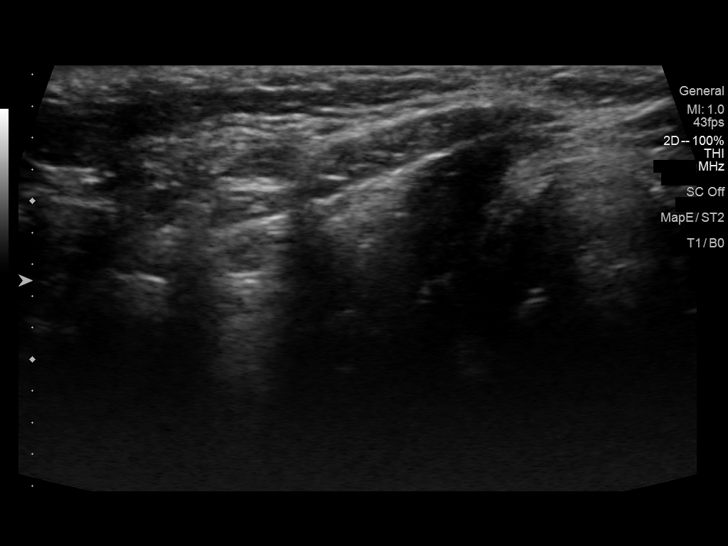
[im 49/49]
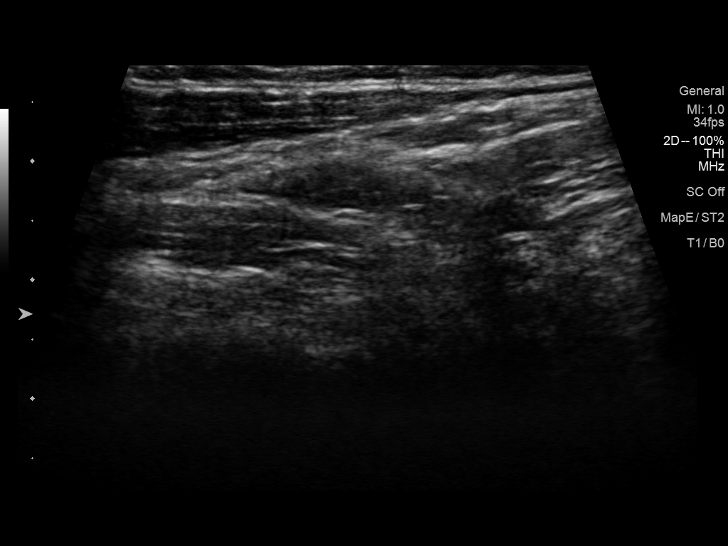

[13 of 25 positions shown; findings below may reference images not displayed]

FINDINGS: Parenchymal Echotexture: Moderately heterogenous

Isthmus: 0.3 cm

Right lobe: 4.1 x 1.2 x 1.8 cm

Left lobe: 3.5 x 0.8 x 1.3 cm

_________________________________________________________

Estimated total number of nodules >/= 1 cm: 1

Number of spongiform nodules >/=  2 cm not described below (TR1): 0

Number of mixed cystic and solid nodules >/= 1.5 cm not described
below (TR2): 0

_________________________________________________________

Multiple small isoechoic nodules are identified scattered throughout
the right and left gland. The nodules are solid, isoechoic with
indistinct borders. None measures larger than 1.4 cm and therefore
do not meet criteria for surveillance or biopsy. A solitary tiny
dystrophic calcification is present in the left upper gland.
IMPRESSION: Heterogeneous multinodular thyroid gland consistent with the
clinical history of multinodular goiter.

None of the thyroid nodules meet criteria to recommend either
imaging surveillance or biopsy.

The above is in keeping with the ACR TI-RADS recommendations - [HOSPITAL] 9128;[DATE].

## 2022-03-19 ENCOUNTER — Ambulatory Visit: Payer: BC Managed Care – PPO | Admitting: Endocrinology

## 2023-02-07 ENCOUNTER — Encounter: Payer: Self-pay | Admitting: Obstetrics and Gynecology

## 2023-03-07 ENCOUNTER — Encounter: Payer: Self-pay | Admitting: Gastroenterology

## 2023-04-12 ENCOUNTER — Ambulatory Visit (AMBULATORY_SURGERY_CENTER): Payer: BC Managed Care – PPO | Admitting: *Deleted

## 2023-04-12 ENCOUNTER — Encounter: Payer: Self-pay | Admitting: Gastroenterology

## 2023-04-12 VITALS — Ht 62.0 in | Wt 142.0 lb

## 2023-04-12 DIAGNOSIS — Z1211 Encounter for screening for malignant neoplasm of colon: Secondary | ICD-10-CM

## 2023-04-12 MED ORDER — SUFLAVE 178.7 G PO SOLR: 1.0000 | Freq: Once | ORAL | 0 refills | Status: AC

## 2023-04-12 NOTE — Progress Notes (Signed)
 Pt's name and DOB verified at the beginning of the pre-visit wit 2 identifiers and permission to speak with Husband present  Pt denies any difficulty with ambulating,sitting, laying down or rolling side to side  Pt has no issues with ambulation   Pt has no issues moving head neck or swallowing  No egg or soy allergy known to patient   No issues known to pt with past sedation with any surgeries or procedures  Pt denies having issues being intubated  Patient denies ever being intubated  No FH of Malignant Hyperthermia  Pt is not on diet pills or shots  Pt is not on home 02   Pt is not on blood thinners   Pt denies issues with constipation   Pt is not on dialysis  Pt denise any abnormal heart rhythms   Pt denies any upcoming cardiac testing  Pt encouraged to use to use Singlecare or Goodrx to reduce cost   Patient's chart reviewed by Norleen Schillings CNRA prior to pre-visit and patient appropriate for the [facility].  Pre-visit completed and red dot placed by patient's name on their procedure day (on provider's schedule).  .  Visit in person  142 lb Pt scale weight is   Instructed pt why it is important to and  to call if they have any changes in health or new medications. Directed them to the # given and on instructions.     Instructions reviewed. Pt given both [facility] main # and MD on call # prior to instructions.  Pt states understanding. Instructed to review again prior to procedure. Pt states they will.   Informed pt that they will receive a text  call from [facility] regarding there prep med. Instructions given to pt with [facility] information sheet

## 2023-04-25 ENCOUNTER — Encounter: Payer: Self-pay | Admitting: Gastroenterology

## 2023-04-25 ENCOUNTER — Ambulatory Visit (AMBULATORY_SURGERY_CENTER): Payer: BC Managed Care – PPO | Admitting: Gastroenterology

## 2023-04-25 VITALS — BP 107/67 | HR 53 | Temp 97.9°F | Resp 12

## 2023-04-25 DIAGNOSIS — Z1211 Encounter for screening for malignant neoplasm of colon: Secondary | ICD-10-CM

## 2023-04-25 MED ORDER — SODIUM CHLORIDE 0.9 % IV SOLN: 500.0000 mL | INTRAVENOUS | Status: DC

## 2023-04-25 NOTE — Op Note (Addendum)
[facility] Patient Name: [name] Procedure Date: 04/25/2023 8:30 AM MRN: [id number] Endoscopist: Lorin Picket E. Tomasa Zamora , MD, [id number] Age: 48 Referring MD:  Date of Birth: 25-Jul-1975 Gender: Female Account #: [id number] Procedure:                Colonoscopy Indications:              Screening for colorectal malignant neoplasm, This                            is the patient's first colonoscopy Medicines:                Monitored Anesthesia Care Procedure:                Pre-Anesthesia Assessment:                           - Prior to the procedure, a History and Physical                            was performed, and patient medications and                            allergies were reviewed. The patient's tolerance of                            previous anesthesia was also reviewed. The risks                            and benefits of the procedure and the sedation                            options and risks were discussed with the patient.                            All questions were answered, and informed consent                            was obtained. Prior Anticoagulants: The patient has                            taken no anticoagulant or antiplatelet agents. ASA                            Grade Assessment: II - A patient with mild systemic                            disease. After reviewing the risks and benefits,                            the patient was deemed in satisfactory condition to                            undergo the procedure.  After obtaining informed consent, the colonoscope                            was passed under direct vision. Throughout the                            procedure, the patient's blood pressure, pulse, and                            oxygen saturations were monitored continuously. The                            Olympus Scope SN: [id number] was introduced through                            the anus and  advanced to the the cecum, identified                            by appendiceal orifice and ileocecal valve. The                            colonoscopy was performed without difficulty. The                            patient tolerated the procedure well. The quality                            of the bowel preparation was excellent. The                            ileocecal valve, appendiceal orifice, and rectum                            were photographed. The bowel preparation used was                            SUFLAVE via split dose instruction. Scope In: 8:39:19 AM Scope Out: 8:50:48 AM Scope Withdrawal Time: 0 hours 7 minutes 52 seconds  Total Procedure Duration: 0 hours 11 minutes 29 seconds  Findings:                 The perianal and digital rectal examinations were                            normal. Pertinent negatives include normal                            sphincter tone and no palpable rectal lesions.                           The colon (entire examined portion) appeared normal.                           The retroflexed view of the distal rectum and anal  verge was normal and showed no anal or rectal                            abnormalities. Complications:            No immediate complications. Estimated Blood Loss:     Estimated blood loss: none. Impression:               - The entire examined colon is normal.                           - The distal rectum and anal verge are normal on                            retroflexion view.                           - No specimens collected. Recommendation:           - Patient has a contact number available for                            emergencies. The signs and symptoms of potential                            delayed complications were discussed with the                            patient. Return to normal activities tomorrow.                            Written discharge instructions were provided to the                             patient.                           - Resume previous diet.                           - Continue present medications.                           - Repeat colonoscopy in 10 years for screening                            purposes. Suzanne Martinec E. Tomasa Rand, MD 04/25/2023 8:55:40 AM This report has been signed electronically.

## 2023-04-25 NOTE — Progress Notes (Signed)
[facility] History and Physical   Primary Care Physician:  Deatra James, MD   Reason for Procedure:   Colon cancer screening  Plan:    Screening colonoscopy     HPI: [name] is a 48 y.o. female undergoing initial average risk screening colonoscopy.  She has no family history of colon cancer and no chronic GI symptoms.    Past Medical History:  Diagnosis Date   Asthma    Hyperlipidemia    Iron (Fe) deficiency anemia    Multinodular goiter     Past Surgical History:  Procedure Laterality Date   TUBAL LIGATION     WISDOM TOOTH EXTRACTION      Prior to Admission medications   Medication Sig Start Date End Date Taking? Authorizing Provider  escitalopram (LEXAPRO) 10 MG tablet Take 10 mg by mouth daily.   Yes [provider]  levothyroxine (SYNTHROID) 25 MCG tablet Take 1 tablet (25 mcg total) by mouth daily. 03/14/21  Yes Romero Belling, MD  Multiple Vitamin (MULTIVITAMIN) capsule Take 1 capsule by mouth daily.   Yes [provider]  rosuvastatin (CRESTOR) 40 MG tablet 1 tablet Orally Once a day for 90 days   Yes [provider]  ferrous sulfate 324 MG TBEC Take 324 mg by mouth.    [provider]  simvastatin (ZOCOR) 20 MG tablet Take 20 mg by mouth daily after supper. Patient not taking: Reported on 04/25/2023    [provider]    Current Outpatient Medications  Medication Sig Dispense Refill   escitalopram (LEXAPRO) 10 MG tablet Take 10 mg by mouth daily.     levothyroxine (SYNTHROID) 25 MCG tablet Take 1 tablet (25 mcg total) by mouth daily. 90 tablet 3   Multiple Vitamin (MULTIVITAMIN) capsule Take 1 capsule by mouth daily.     rosuvastatin (CRESTOR) 40 MG tablet 1 tablet Orally Once a day for 90 days     ferrous sulfate 324 MG TBEC Take 324 mg by mouth.     simvastatin (ZOCOR) 20 MG tablet Take 20 mg by mouth daily after supper. (Patient not taking: Reported on 04/25/2023)     Current Facility-Administered  Medications  Medication Dose Route Frequency Provider Last Rate Last Admin   0.9 %  sodium chloride infusion  500 mL Intravenous Continuous Jenel Lucks, MD        Allergies as of 04/25/2023   (No Known Allergies)    Family History  Problem Relation Age of Onset   Colon polyps Neg Hx    Colon cancer Neg Hx    Esophageal cancer Neg Hx    Rectal cancer Neg Hx    Stomach cancer Neg Hx     Social History   Socioeconomic History   Marital status: Married    Spouse name: Not on file   Number of children: Not on file   Years of education: Not on file   Highest education level: Not on file  Occupational History   Not on file  Tobacco Use   Smoking status: Never   Smokeless tobacco: Never  Vaping Use   Vaping status: Never Used  Substance and Sexual Activity   Alcohol use: Yes    Alcohol/week: 0.0 standard drinks of alcohol   Drug use: Never   Sexual activity: Yes    Birth control/protection: Surgical  Other Topics Concern   Not on file  Social History Narrative   Not on file   Social Drivers of Health   Financial  Resource Strain: Not on file  Food Insecurity: Not on file  Transportation Needs: Not on file  Physical Activity: Not on file  Stress: Not on file  Social Connections: Not on file  Intimate Partner Violence: Not on file    Review of Systems:  All other review of systems negative except as mentioned in the HPI.  Physical Exam: Vital signs BP 132/78   Pulse 61   Temp 97.9 F (36.6 C) (Temporal)   SpO2 98%   General:   Alert,  Well-developed, well-nourished, pleasant and cooperative in NAD Airway:  Mallampati 1 Lungs:  Clear throughout to auscultation.   Heart:  Regular rate and rhythm; no murmurs, clicks, rubs,  or gallops. Abdomen:  Soft, nontender and nondistended. Normal bowel sounds.   Neuro/Psych:  Normal mood and affect. A and O x 3   Latonyia Lopata E. Tomasa Rand, MD [facility]

## 2023-04-25 NOTE — Progress Notes (Signed)
 Pt's states no medical or surgical changes since previsit or office visit.

## 2023-04-25 NOTE — Patient Instructions (Signed)
-  repeat colonoscopy in 10 years  for surveillance recommended.  -Continue present medications    YOU HAD AN ENDOSCOPIC PROCEDURE TODAY AT THE [facility]:   Refer to the procedure report that was given to you for any specific questions about what was found during the examination.  If the procedure report does not answer your questions, please call your gastroenterologist to clarify.  If you requested that your care partner not be given the details of your procedure findings, then the procedure report has been included in a sealed envelope for you to review at your convenience later.  YOU SHOULD EXPECT: Some feelings of bloating in the abdomen. Passage of more gas than usual.  Walking can help get rid of the air that was put into your GI tract during the procedure and reduce the bloating. If you had a lower endoscopy (such as a colonoscopy or flexible sigmoidoscopy) you may notice spotting of blood in your stool or on the toilet paper. If you underwent a bowel prep for your procedure, you may not have a normal bowel movement for a few days.  Please Note:  You might notice some irritation and congestion in your nose or some drainage.  This is from the oxygen used during your procedure.  There is no need for concern and it should clear up in a day or so.  SYMPTOMS TO REPORT IMMEDIATELY:  Following lower endoscopy (colonoscopy or flexible sigmoidoscopy):  Excessive amounts of blood in the stool  Significant tenderness or worsening of abdominal pains  Swelling of the abdomen that is new, acute  Fever of 100F or higher  For urgent or emergent issues, a gastroenterologist can be reached at any hour by calling [phone]. Do not use MyChart messaging for urgent concerns.    DIET:  We do recommend a small meal at first, but then you may proceed to your regular diet.  Drink plenty of fluids but you should avoid alcoholic beverages for 24 hours.  ACTIVITY:  You should plan to take it  easy for the rest of today and you should NOT DRIVE or use heavy machinery until tomorrow (because of the sedation medicines used during the test).    FOLLOW UP: Our staff will call the number listed on your records the next business day following your procedure.  We will call around 7:15- 8:00 am to check on you and address any questions or concerns that you may have regarding the information given to you following your procedure. If we do not reach you, we will leave a message.     If any biopsies were taken you will be contacted by phone or by letter within the next 1-3 weeks.  Please call us at [phone] if you have not heard about the biopsies in 3 weeks.    SIGNATURES/CONFIDENTIALITY: You and/or your care partner have signed paperwork which will be entered into your electronic medical record.  These signatures attest to the fact that that the information above on your After Visit Summary has been reviewed and is understood.  Full responsibility of the confidentiality of this discharge information lies with you and/or your care-partner.

## 2023-04-25 NOTE — Progress Notes (Signed)
 A/o x 3, VSS, gd SR's, pleased with anesthesia, report to RN

## 2023-04-26 ENCOUNTER — Telehealth: Payer: Self-pay | Admitting: *Deleted

## 2023-04-26 NOTE — Telephone Encounter (Signed)
  Follow up Call-     04/25/2023    7:54 AM  Call back number  Post procedure Call Back phone  # [phone] daughters number, ok to speak to her  Permission to leave phone message Yes     Patient questions:  Do you have a fever, pain , or abdominal swelling? No. Pain Score  0 *  Have you tolerated food without any problems? Yes.    Have you been able to return to your normal activities? Yes.    Do you have any questions about your discharge instructions: Diet   No. Medications  No. Follow up visit  No.  Do you have questions or concerns about your Care? No.  Actions: * If pain score is 4 or above: No action needed, pain <4.
